# Patient Record
Sex: Male | Born: 1965 | Race: White | Hispanic: No | Marital: Married | Smoking: Never smoker
Health system: Southern US, Community
[De-identification: ages and names within clinical notes are randomized; demographics above are authoritative.]

## PROBLEM LIST (undated history)

## (undated) DIAGNOSIS — R079 Chest pain, unspecified: Secondary | ICD-10-CM

## (undated) DIAGNOSIS — R140 Abdominal distension (gaseous): Secondary | ICD-10-CM

## (undated) DIAGNOSIS — K219 Gastro-esophageal reflux disease without esophagitis: Secondary | ICD-10-CM

## (undated) DIAGNOSIS — K148 Other diseases of tongue: Secondary | ICD-10-CM

## (undated) DIAGNOSIS — R430 Anosmia: Secondary | ICD-10-CM

## (undated) DIAGNOSIS — M1990 Unspecified osteoarthritis, unspecified site: Secondary | ICD-10-CM

## (undated) DIAGNOSIS — I499 Cardiac arrhythmia, unspecified: Secondary | ICD-10-CM

## (undated) DIAGNOSIS — E785 Hyperlipidemia, unspecified: Secondary | ICD-10-CM

## (undated) DIAGNOSIS — R739 Hyperglycemia, unspecified: Secondary | ICD-10-CM

## (undated) DIAGNOSIS — R7989 Other specified abnormal findings of blood chemistry: Secondary | ICD-10-CM

## (undated) DIAGNOSIS — J309 Allergic rhinitis, unspecified: Secondary | ICD-10-CM

## (undated) DIAGNOSIS — K5790 Diverticulosis of intestine, part unspecified, without perforation or abscess without bleeding: Secondary | ICD-10-CM

## (undated) DIAGNOSIS — N522 Drug-induced erectile dysfunction: Secondary | ICD-10-CM

## (undated) DIAGNOSIS — R1013 Epigastric pain: Secondary | ICD-10-CM

## (undated) DIAGNOSIS — I10 Essential (primary) hypertension: Secondary | ICD-10-CM

## (undated) DIAGNOSIS — R1110 Vomiting, unspecified: Secondary | ICD-10-CM

## (undated) DIAGNOSIS — R1310 Dysphagia, unspecified: Secondary | ICD-10-CM

## (undated) DIAGNOSIS — I491 Atrial premature depolarization: Secondary | ICD-10-CM

## (undated) HISTORY — DX: Unspecified osteoarthritis, unspecified site: M19.90

## (undated) HISTORY — DX: Dysphagia, unspecified: R13.10

## (undated) HISTORY — DX: Atrial premature depolarization: I49.1

## (undated) HISTORY — DX: Gastro-esophageal reflux disease without esophagitis: K21.9

## (undated) HISTORY — DX: Other diseases of tongue: K14.8

## (undated) HISTORY — DX: Diverticulosis of intestine, part unspecified, without perforation or abscess without bleeding: K57.90

## (undated) HISTORY — DX: Epigastric pain: R10.13

## (undated) HISTORY — DX: Hyperlipidemia, unspecified: E78.5

## (undated) HISTORY — DX: Allergic rhinitis, unspecified: J30.9

## (undated) HISTORY — DX: Chest pain, unspecified: R07.9

## (undated) HISTORY — DX: Cardiac arrhythmia, unspecified: I49.9

## (undated) HISTORY — DX: Essential (primary) hypertension: I10

## (undated) HISTORY — DX: Hyperglycemia, unspecified: R73.9

## (undated) HISTORY — DX: Other specified abnormal findings of blood chemistry: R79.89

## (undated) HISTORY — DX: Anosmia: R43.0

## (undated) HISTORY — DX: Abdominal distension (gaseous): R14.0

## (undated) HISTORY — DX: Drug-induced erectile dysfunction: N52.2

## (undated) HISTORY — DX: Vomiting, unspecified: R11.10

---

## 1999-05-24 ENCOUNTER — Encounter: Payer: Self-pay | Admitting: Family Medicine

## 1999-05-24 ENCOUNTER — Ambulatory Visit (HOSPITAL_COMMUNITY): Admission: RE | Admit: 1999-05-24 | Discharge: 1999-05-24 | Payer: Self-pay | Admitting: Family Medicine

## 2001-11-14 ENCOUNTER — Ambulatory Visit (HOSPITAL_COMMUNITY): Admission: RE | Admit: 2001-11-14 | Discharge: 2001-11-14 | Payer: Self-pay | Admitting: Family Medicine

## 2001-11-14 ENCOUNTER — Encounter: Payer: Self-pay | Admitting: Family Medicine

## 2005-09-17 ENCOUNTER — Encounter: Admission: RE | Admit: 2005-09-17 | Discharge: 2005-09-17 | Payer: Self-pay | Admitting: Otolaryngology

## 2014-05-25 ENCOUNTER — Emergency Department (HOSPITAL_COMMUNITY)
Admission: EM | Admit: 2014-05-25 | Discharge: 2014-05-25 | Disposition: A | Discharge status: Home / Self Care | Payer: BLUE CROSS/BLUE SHIELD | Attending: Emergency Medicine | Admitting: Emergency Medicine

## 2014-05-25 ENCOUNTER — Other Ambulatory Visit: Payer: Self-pay

## 2014-05-25 ENCOUNTER — Emergency Department (HOSPITAL_COMMUNITY): Payer: BLUE CROSS/BLUE SHIELD

## 2014-05-25 ENCOUNTER — Encounter (HOSPITAL_COMMUNITY): Payer: Self-pay | Admitting: *Deleted

## 2014-05-25 DIAGNOSIS — R079 Chest pain, unspecified: Secondary | ICD-10-CM | POA: Diagnosis present

## 2014-05-25 DIAGNOSIS — R002 Palpitations: Secondary | ICD-10-CM

## 2014-05-25 DIAGNOSIS — R0602 Shortness of breath: Secondary | ICD-10-CM | POA: Diagnosis not present

## 2014-05-25 LAB — BRAIN NATRIURETIC PEPTIDE: B Natriuretic Peptide: 23.9 pg/mL (ref 0.0–100.0)

## 2014-05-25 LAB — I-STAT TROPONIN, ED: Troponin i, poc: 0 ng/mL (ref 0.00–0.08)

## 2014-05-25 LAB — CBC WITH DIFFERENTIAL/PLATELET
BASOS ABS: 0 10*3/uL (ref 0.0–0.1)
Basophils Relative: 0 % (ref 0–1)
EOS ABS: 0 10*3/uL (ref 0.0–0.7)
Eosinophils Relative: 1 % (ref 0–5)
HCT: 38.7 % — ABNORMAL LOW (ref 39.0–52.0)
Hemoglobin: 13.8 g/dL (ref 13.0–17.0)
LYMPHS PCT: 28 % (ref 12–46)
Lymphs Abs: 1.7 10*3/uL (ref 0.7–4.0)
MCH: 32.6 pg (ref 26.0–34.0)
MCHC: 35.7 g/dL (ref 30.0–36.0)
MCV: 91.5 fL (ref 78.0–100.0)
MONOS PCT: 7 % (ref 3–12)
Monocytes Absolute: 0.4 10*3/uL (ref 0.1–1.0)
Neutro Abs: 3.8 10*3/uL (ref 1.7–7.7)
Neutrophils Relative %: 64 % (ref 43–77)
PLATELETS: 199 10*3/uL (ref 150–400)
RBC: 4.23 MIL/uL (ref 4.22–5.81)
RDW: 12.5 % (ref 11.5–15.5)
WBC: 5.9 10*3/uL (ref 4.0–10.5)

## 2014-05-25 LAB — COMPREHENSIVE METABOLIC PANEL
ALBUMIN: 3.9 g/dL (ref 3.5–5.2)
ALT: 26 U/L (ref 0–53)
AST: 26 U/L (ref 0–37)
Alkaline Phosphatase: 78 U/L (ref 39–117)
Anion gap: 8 (ref 5–15)
BUN: 16 mg/dL (ref 6–23)
CHLORIDE: 105 mmol/L (ref 96–112)
CO2: 23 mmol/L (ref 19–32)
CREATININE: 0.99 mg/dL (ref 0.50–1.35)
Calcium: 9 mg/dL (ref 8.4–10.5)
GFR calc Af Amer: >90 mL/min (ref >90)
GFR calc non Af Amer: >90 mL/min (ref >90)
GLUCOSE: 91 mg/dL (ref 70–99)
POTASSIUM: 3.8 mmol/L (ref 3.5–5.1)
Sodium: 136 mmol/L (ref 135–145)
TOTAL PROTEIN: 6.6 g/dL (ref 6.0–8.3)
Total Bilirubin: 0.6 mg/dL (ref 0.3–1.2)

## 2014-05-25 LAB — D-DIMER, QUANTITATIVE: D-Dimer, Quant: <0.27 ug/mL-FEU (ref 0.00–0.48)

## 2014-05-25 LAB — TSH: TSH: 1.068 u[IU]/mL (ref 0.350–4.500)

## 2014-05-25 NOTE — Discharge Instructions (Signed)
Your lab work, chest xray all normal. Your ECG shows something we call "ectopic atrial rhythm." It could be what is causing your symptoms. This will need further evaluation, and you will be called by cardiology in the next few days for an apt. Please follow up with them.    Palpitations A palpitation is the feeling that your heartbeat is irregular or is faster than normal. It may feel like your heart is fluttering or skipping a beat. Palpitations are usually not a serious problem. However, in some cases, you may need further medical evaluation. CAUSES  Palpitations can be caused by:  Smoking.  Caffeine or other stimulants, such as diet pills or energy drinks.  Alcohol.  Stress and anxiety.  Strenuous physical activity.  Fatigue.  Certain medicines.  Heart disease, especially if you have a history of irregular heart rhythms (arrhythmias), such as atrial fibrillation, atrial flutter, or supraventricular tachycardia.  An improperly working pacemaker or defibrillator. DIAGNOSIS  To find the cause of your palpitations, your health care provider will take your medical history and perform a physical exam. Your health care provider may also have you take a test called an ambulatory electrocardiogram (ECG). An ECG records your heartbeat patterns over a 24-hour period. You may also have other tests, such as:  Transthoracic echocardiogram (TTE). During echocardiography, sound waves are used to evaluate how blood flows through your heart.  Transesophageal echocardiogram (TEE).  Cardiac monitoring. This allows your health care provider to monitor your heart rate and rhythm in real time.  Holter monitor. This is a portable device that records your heartbeat and can help diagnose heart arrhythmias. It allows your health care provider to track your heart activity for several days, if needed.  Stress tests by exercise or by giving medicine that makes the heart beat faster. TREATMENT  Treatment  of palpitations depends on the cause of your symptoms and can vary greatly. Most cases of palpitations do not require any treatment other than time, relaxation, and monitoring your symptoms. Other causes, such as atrial fibrillation, atrial flutter, or supraventricular tachycardia, usually require further treatment. HOME CARE INSTRUCTIONS   Avoid:  Caffeinated coffee, tea, soft drinks, diet pills, and energy drinks.  Chocolate.  Alcohol.  Stop smoking if you smoke.  Reduce your stress and anxiety. Things that can help you relax include:  A method of controlling things in your body, such as your heartbeats, with your mind (biofeedback).  Yoga.  Meditation.  Physical activity such as swimming, jogging, or walking.  Get plenty of rest and sleep. SEEK MEDICAL CARE IF:   You continue to have a fast or irregular heartbeat beyond 24 hours.  Your palpitations occur more often. SEEK IMMEDIATE MEDICAL CARE IF:  You have chest pain or shortness of breath.  You have a severe headache.  You feel dizzy or you faint. MAKE SURE YOU:  Understand these instructions.  Will watch your condition.  Will get help right away if you are not doing well or get worse. Document Released: 03/17/2000 Document Revised: 03/25/2013 Document Reviewed: 05/19/2011 [facility] Patient Information 2015 [employer] [place]. This information is not intended to replace advice given to you by your health care provider. Make sure you discuss any questions you have with your health care provider.

## 2014-05-25 NOTE — ED Notes (Signed)
[facility] rt recurrent chest pain lasting on and off again over the past month. Pt describes chest pain with occasional radiation to the neck and SOB. Pt received 324 mg of aspirin and 1 nitro PTA and is currently pain free. Pt does travel often via airplane rt work.

## 2014-05-25 NOTE — ED Provider Notes (Signed)
CSN: [id number]     Arrival date & time 05/25/14  1737 History   First MD Initiated Contact with Patient 05/25/14 1745     Chief Complaint  Patient presents with  . Chest Pain     (Consider location/radiation/quality/duration/timing/severity/associated sxs/prior Treatment) HPI Roselind RilyKeith Servello is a 49 y.o. male with no medical problems, presents to emergency department complaining of chest pain. Patient states his symptoms began approximately 1 month ago. He reports generalized chest tightness, palpitations, sensation of "heart pounding" and some shortness of breath. States symptoms are worse when he is laying still or not doing anything. States better when he is busy. He states he has been taking 325 mg of aspirin for the last several days. He states pain sometimes radiates to the neck. He reports shortness of breath with exertion. He admits to travel by airplane weekly, states he has been doing that for multiple years. He admits to daily coffee, but states he has also been doing that for years. He denies any new medications, and he does not take any over-the-counter medications or supplements other than aspirin. He denies any swelling in his extremities. He denies orthopnea. He denies any fever or chills. No cough. No history of similar pain in the past. He reports symptoms are constant and still having them at present.  History reviewed. No pertinent past medical history. History reviewed. No pertinent past surgical history. No family history on file. History  Substance Use Topics  . Smoking status: Never Smoker   . Smokeless tobacco: Not on file  . Alcohol Use: Yes     Comment: social    Review of Systems  Constitutional: Negative for fever and chills.  Respiratory: Positive for chest tightness and shortness of breath. Negative for cough.   Cardiovascular: Positive for chest pain and palpitations. Negative for leg swelling.  Gastrointestinal: Negative for nausea, vomiting, abdominal pain,  diarrhea and abdominal distention.  Genitourinary: Negative for dysuria, urgency, frequency and hematuria.  Musculoskeletal: Negative for myalgias, arthralgias, neck pain and neck stiffness.  Skin: Negative for rash.  Neurological: Negative for dizziness, weakness, light-headedness, numbness and headaches.  All other systems reviewed and are negative.     Allergies  Review of patient's allergies indicates no known allergies.  Home Medications   Prior to Admission medications   Not on File   BP 112/82 mmHg  Pulse 75  Temp(Src) 98.3 F (36.8 C) (Oral)  Resp 17  Ht 6' (1.829 m)  Wt 207 lb (93.895 kg)  BMI 28.07 kg/m2  SpO2 99% Physical Exam  Constitutional: He appears well-developed and well-nourished. No distress.  HENT:  Head: Normocephalic and atraumatic.  Eyes: Conjunctivae are normal.  Neck: Neck supple.  Cardiovascular: Normal rate, regular rhythm and normal heart sounds.   Pulmonary/Chest: Effort normal. No respiratory distress. He has no wheezes. He has no rales.  Abdominal: Soft. Bowel sounds are normal. He exhibits no distension. There is no tenderness. There is no rebound.  Musculoskeletal: He exhibits no edema.  Neurological: He is alert.  Skin: Skin is warm and dry.  Nursing note and vitals reviewed.   ED Course  Procedures (including critical care time) Labs Review Labs Reviewed  CBC WITH DIFFERENTIAL/PLATELET - Abnormal; Notable for the following:    HCT 38.7 (*)    All other components within normal limits  COMPREHENSIVE METABOLIC PANEL  BRAIN NATRIURETIC PEPTIDE  D-DIMER, QUANTITATIVE  TSH  I-STAT TROPOININ, ED    Imaging Review Dg Chest 2 View  05/25/2014   CLINICAL  DATA:  Shortness of breath and midsternal chest pain, constant for 1 month. Nonsmoker.  EXAM: CHEST  2 VIEW  COMPARISON:  Thoracic spine 03/24/2008.  FINDINGS: The heart size and mediastinal contours are within normal limits. Both lungs are clear. The visualized skeletal structures  are unremarkable.  IMPRESSION: No active cardiopulmonary disease.   Electronically Signed   By: Burman Nieves M.D.   On: 05/25/2014 18:50     Date: 05/25/2014  Rate: 70  Rhythm: ectopic atrial rhythm  QRS Axis: normal  Intervals: normal  ST/T Wave abnormalities: early repolarization  Conduction Disutrbances:none  Narrative Interpretation:   Old EKG Reviewed: none available    MDM   Final diagnoses:  Palpitations    Pt with chest pain constant for a month, mostly concerned about "pounding sensation" in the chest. Mainly at rest. Pt's CP is atypical. His heart score is 2 - repolarization on ECG and age. Pt's work up including D dimer, BNP, trop, CXR all negative. Pt's ECG showing ectopic atrial rhythm. i discussed pt's symptoms and ECG findings with cardiology on call. Advised outpatient evaluation is appropriate with possible holter. They will schedule a call back for him for his apt. Discussed results with pt. He will follow up with cardiology outpatient. Currently VS normal. He is in no distress. Comfortable and non toxic appearing.   Filed Vitals:   05/25/14 1740 05/25/14 1745 05/25/14 1815 05/25/14 2000  BP: 142/65 112/82 141/84 143/75  Pulse: 66 75 76 73  Temp:  98.3 F (36.8 C)    TempSrc:  Oral    Resp: Height:  6' (1.829 m)    Weight:  207 lb (93.895 kg)    SpO2: 98% 99% 100% 98%       Lottie Mussel, PA-C 05/25/14 2324  Arby Barrette, MD 05/27/14 [phone]

## 2014-05-29 ENCOUNTER — Ambulatory Visit (INDEPENDENT_AMBULATORY_CARE_PROVIDER_SITE_OTHER): Payer: BLUE CROSS/BLUE SHIELD | Admitting: Cardiovascular Disease

## 2014-05-29 ENCOUNTER — Encounter: Payer: Self-pay | Admitting: Cardiovascular Disease

## 2014-05-29 VITALS — BP 116/90 | HR 73 | Ht 72.0 in | Wt 208.8 lb

## 2014-05-29 DIAGNOSIS — I491 Atrial premature depolarization: Secondary | ICD-10-CM

## 2014-05-29 DIAGNOSIS — R002 Palpitations: Secondary | ICD-10-CM

## 2014-05-29 DIAGNOSIS — I498 Other specified cardiac arrhythmias: Secondary | ICD-10-CM

## 2014-05-29 HISTORY — DX: Atrial premature depolarization: I49.1

## 2014-05-29 NOTE — Patient Instructions (Signed)
NO CHANGES WITH MEDICATIONS.  Your physician wants you to follow-up in: 6 MONTHS WITH DR. Elease HashimotoNAHSER. You will receive a reminder letter in the mail two months in advance. If you don't receive a letter, please call our office to schedule the follow-up appointment.

## 2014-05-29 NOTE — Progress Notes (Signed)
Cardiology Office Note   Date:  05/29/2014   ID:  Devon Whitaker, DOB 1965/09/21, MRN [id number]  PCP:  No primary care provider on file.  Cardiologist:   Nahser, Deloris Ping, MD   Chief Complaint  Patient presents with  . Palpitations      History of Present Illness: Devon Whitaker is a 49 y.o. male who presents for follow up of his palpitations.  He complains of CP and palpitations for the past month.  Pressure, tightness, discomfort.  Occurs at various times  - usually more prominent when he is resting.  Does not notice it when he is active.   Exercises regularly.   Has some fatigue.  Fatigues  Earlier  at the gym now compared to last year.   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  No past medical history on file.  No past surgical history on file.   Current Outpatient Prescriptions  Medication Sig Dispense Refill  . aspirin 325 MG tablet Take 325 mg by mouth daily.     No current facility-administered medications for this visit.    Allergies:   Review of patient's allergies indicates no known allergies.    Social History:  The patient  reports that he has never smoked. He does not have any smokeless tobacco history on file. He reports that he drinks alcohol.   Family History:  The patient's family history is not on file.    ROS:  Please see the history of present illness.    Review of Systems: Constitutional:  denies fever, chills, diaphoresis, appetite change and fatigue.  HEENT: denies photophobia, eye pain, redness, hearing loss, ear pain, congestion, sore throat, rhinorrhea, sneezing, neck pain, neck stiffness and tinnitus.  Respiratory: denies SOB, DOE, cough, chest tightness, and wheezing.  Cardiovascular: admits to chest pain, palpitations, denies any  leg swelling.  Gastrointestinal: denies nausea, vomiting, abdominal pain, diarrhea, constipation, blood in stool.  Genitourinary: denies dysuria, urgency, frequency, hematuria, flank pain and  difficulty urinating.  Musculoskeletal: denies  myalgias, back pain, joint swelling, arthralgias and gait problem.   Skin: denies pallor, rash and wound.  Neurological: denies dizziness, seizures, syncope, weakness, light-headedness, numbness and headaches.   Hematological: denies adenopathy, easy bruising, personal or family bleeding history.  Psychiatric/ Behavioral: denies suicidal ideation, mood changes, confusion, nervousness, sleep disturbance and agitation.       All other systems are reviewed and negative.    PHYSICAL EXAM: VS:  BP 116/90 mmHg  Pulse 73  Ht 6' (1.829 m)  Wt 208 lb 12.8 oz (94.711 kg)  BMI 28.31 kg/m2 , BMI Body mass index is 28.31 kg/(m^2). GEN: Well nourished, well developed, in no acute distress HEENT: normal Neck: no JVD, carotid bruits, or masses Cardiac: RRR; no murmurs, rubs, or gallops,no edema  Respiratory:  clear to auscultation bilaterally, normal work of breathing GI: soft, nontender, nondistended, + BS MS: no deformity or atrophy Skin: warm and dry, no rash Neuro:  Strength and sensation are intact Psych: normal   EKG:  EKG is ordered today. The ekg ordered today demonstrates NSR at 73.  Normal ECG Previous ECG in the ER showed ectopic atrial rhytm.    Recent Labs: 05/25/2014: ALT 26; B Natriuretic Peptide 23.9; BUN 16; Creatinine 0.99; Hemoglobin 13.8; Platelets 199; Potassium 3.8; Sodium 136; TSH 1.068    Lipid Panel No results found for: CHOL, TRIG, HDL, CHOLHDL, VLDL, LDLCALC, LDLDIRECT    Wt Readings from Last 3 Encounters:  05/29/14 208 lb 12.8 oz (  94.711 kg)  05/25/14 207 lb (93.895 kg)      Other studies Reviewed: Additional studies/ records that were reviewed today include: . Review of the above records demonstrates:    ASSESSMENT AND PLAN:  1.  Chest discomfort/palpitations: The patient presents with very atypical chest discomfort. He's had vague discomfort for the past months. Is not worsened with exercise. He  occasionally has some palpitations. In the emergency room, he was found have an ectopic atrial rhythm.  He feels quite well when he exercises. In fact, he works out on a regular basis and never had any issues during his exercise regimen.  I do not think a stress test would be of any benefit.  He works 17 hour days - 2 days a week [place] Marland [employer]. He then flies home to [place]. In addition, he drinks a fair amount of alcohol when he  comes home for the weekend.  I suspect that he may need to cut back on his alcohol and get a bit more sleep.  I will see him in 6 months for follow up office visit. Marland [place].  He will call sooner if her has any problems.    Current medicines are reviewed at length with the patient today.  The patient does not have concerns regarding medicines.  The following changes have been made:  no change   Disposition:   FU with me in 6 months.     Signed, Nahser, Deloris PingPhilip J, MD  05/29/2014 3:57 PM    [facility] [address], [place], [place]  [address] Phone: [phone]; Fax: [phone]

## 2014-07-10 ENCOUNTER — Encounter: Payer: BLUE CROSS/BLUE SHIELD | Admitting: Cardiology

## 2015-09-20 DIAGNOSIS — L820 Inflamed seborrheic keratosis: Secondary | ICD-10-CM | POA: Diagnosis not present

## 2015-09-20 DIAGNOSIS — L918 Other hypertrophic disorders of the skin: Secondary | ICD-10-CM | POA: Diagnosis not present

## 2015-09-20 DIAGNOSIS — L570 Actinic keratosis: Secondary | ICD-10-CM | POA: Diagnosis not present

## 2015-09-20 DIAGNOSIS — D225 Melanocytic nevi of trunk: Secondary | ICD-10-CM | POA: Diagnosis not present

## 2015-09-20 DIAGNOSIS — X32XXXA Exposure to sunlight, initial encounter: Secondary | ICD-10-CM | POA: Diagnosis not present

## 2015-11-03 ENCOUNTER — Telehealth: Payer: Self-pay | Admitting: Cardiovascular Disease

## 2015-11-03 NOTE — Telephone Encounter (Signed)
Left message on several phone lines for patient to call back.

## 2015-11-03 NOTE — Telephone Encounter (Signed)
° °  Pt c/o Shortness Of Breath: STAT if SOB developed within the last 24 hours or pt is noticeably SOB on the phone  1. Are you currently SOB (can you hear that pt is SOB on the phone)? No  2. How long have you been experiencing SOB? Last time was last weekend  3. Are you SOB when sitting or when up moving around? Both  4. Are you currently experiencing any other symptoms? Tingling in his left toes, foot, and radiating up left leg.

## 2015-11-09 ENCOUNTER — Telehealth: Payer: Self-pay | Admitting: Cardiovascular Disease

## 2015-11-09 NOTE — Telephone Encounter (Signed)
Mr. Devon Whitaker returning call.  States he has been SOB and some tightness in his chest that he describes as off and on for several days. [place] and will not be back in town until Thursday 8/10. Advised we do not have any app available at this time but to call back on Thursday when he gets back to town.  Also advised if this persists or gets worse will need to go to ER or an Urgent Care. He verbalizes understanding and will call us back Thursday.

## 2015-11-09 NOTE — Telephone Encounter (Signed)
New message   Pt verbalized that he was instructed to call and request the RN Synetta Failnita in triage   Per Synetta Failnita she will call him back

## 2015-11-09 NOTE — Telephone Encounter (Signed)
Wife calling requesting an earlier appointment than Oct.  Advised we do not have DPR to speak with her.  Mr. Devon Whitaker is out of town and will return on Thursday.  She will have him call us back today.

## 2015-11-09 NOTE — Telephone Encounter (Signed)
Follow up ° ° ° ° ° °Returning a call to the nurse °

## 2015-11-11 ENCOUNTER — Telehealth: Payer: Self-pay | Admitting: *Deleted

## 2015-11-11 NOTE — Telephone Encounter (Signed)
Calling back stating he is driving back from [place]  States tightness in chest is some better but still feels the discomfort.  Made him an appointment for Tues w/Katie Janee Mornhompson, [place].  Advised if symptoms increased will need to go to ER or urgent care.  He verbalizes understanding and will see how he feels over the weekend.  He will call and cancel the appointment on Tues if he does go to ER and is treated.

## 2015-11-11 NOTE — Progress Notes (Signed)
Cardiology Office Note    Date:  11/16/2015   ID:  Devon Whitaker, DOB 07/15/1965, MRN [id number]  PCP:  Redmond Baseman, MD  Cardiologist:  Dr. Elease Hashimoto  CC: chest tightness and SOB  History of Present Illness:  Devon Whitaker is a 50 y.o. male with a past medical history of an ectopic atrial arrhythmia and atypical chest pain/palpitations who presents to clinic for evaluation of chest tightness.   He was seen in the [facility] ED on 05/27/14 for evaluation of chest pain. Work up including D dimer, BNP, trop, CXR all negative. Pt's ECG showed an ectopic atrial rhythm. He was discharged home with outpatient follow up.   He was seen by Dr. Elease Hashimoto in clinic on 05/29/2014 for new patient evaluation of chest pain and palpitations. Most predominant sx was palpitations. Dr. Elease Hashimoto felt like his pain was very atypical and that he needed to get more sleep and cut back on alcohol consumption. He noted very regular exercise with no problems and worked 17 hour days in [place]. His palpitations did eventually improve.   Today he presents to clinic for follow up. About 2 weeks ago he started to notice worsening SOB and chest pressure that radiates into his back. He notices this sometime after breakfast and its constant until the evening time. Not worse with exertion and he does exercise regularly, including this AM (30 minutes on treadmill and heavy weight lifting). Symptoms persistent during work out but actually better then other times and right now (currently 8/10). He notices the chest tightness and SOB gets worse after meals. No LE edema, orthopnea or PND. No more palpitations. He has not tried any OTC meds such as PPIs or H2 blockers. He does have  A lot of anxiety in his life with constant travel.   He does not smoke and has no HTN, HLD, DM or fam hx of early CAD. Maternal Aunt did possibly have heart attack in 17s?   Past Medical History:  Diagnosis Date  . 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  Current Medications: Outpatient Medications Prior to Visit  Medication Sig Dispense Refill  . aspirin 325 MG tablet Take 325 mg by mouth daily.     No facility-administered medications prior to visit.      Allergies:   Review of patient's allergies indicates no known allergies.   Social History   Social History  . Marital status: Married    Spouse name: N/A  . Number of children: N/A  . Years of education: N/A   Social History Main Topics  . Smoking status: Never Smoker  . Smokeless tobacco: None  . Alcohol use Yes     Comment: social  . Drug use: Unknown  . Sexual activity: Not Asked   Other Topics Concern  . None   Social History Narrative  . None     Family History:  The patient's   Heart attack in his maternal aunt  ROS:   Please see the history of present illness.    ROS All other systems reviewed and are negative.   PHYSICAL EXAM:   VS:  BP 122/74   Pulse 77   Ht 6' (1.829 m)   Wt 200 lb (90.7 kg)   BMI 27.12 kg/m    GEN: Well nourished, well developed, in no acute distress  HEENT: normal  Neck: no JVD, carotid bruits, or masses Cardiac: RRR; no murmurs, rubs, or gallops,no edema  Respiratory:  clear to auscultation bilaterally, normal work of breathing GI: soft, nontender, nondistended, + BS MS: no deformity or atrophy  Skin: warm and dry, no rash Neuro:  Alert and Oriented x 3, Strength and sensation are intact Psych: euthymic mood, full affect  Wt Readings from Last 3 Encounters:  11/16/15 200 lb (90.7 kg)  05/29/14 208 lb 12.8 oz (94.7 kg)  05/25/14 207 lb (93.9 kg)      Studies/Labs Reviewed:   EKG:  EKG is ordered today.  The ekg ordered today demonstrates NSR HR 77 Recent Labs: No results found for requested labs within last 8760 hours.   Lipid Panel No results found for: CHOL, TRIG, HDL, CHOLHDL, VLDL, LDLCALC, LDLDIRECT  Additional studies/ records that were reviewed today include:   none   ASSESSMENT & PLAN:   Chest pain and SOB: atypical for cardiac etiology. Better with exertion and worse after meals. No RFs for CAD. ECG totally normal. Will start PPI to see if this helps and get POET for his own reassurance. If PPI doesn't help and stress test normal; likely 2/2 to anxiety.    Medication Adjustments/Labs and Tests Ordered: Current medicines are reviewed at length with the patient today.  Concerns regarding medicines are outlined above.  Medication changes, Labs and Tests ordered today are listed in the Patient Instructions below. Patient Instructions  Medication Instructions:  Your physician has recommended you make the following change in your medication:  1.  START Protonix 40 mg taking 1 tablet twice a day   Labwork: NONE ORDERED   Testing/Procedures: Your physician has requested that you have an exercise tolerance test. For further information please visit [email or web address] Please also follow instruction sheet, as given.  Follow-Up: Your physician recommends that you schedule a follow-up appointment in: SEE DR. NAHSER AS PLANNED   Any Other Special Instructions Will Be Listed Below (If Applicable).   Exercise Stress Electrocardiogram An exercise stress electrocardiogram is a test to check how blood flows to your heart. It is done to find areas of poor blood flow. You will need to walk on a treadmill for this test. The electrocardiogram will record your heartbeat when you are at rest and when you are exercising. BEFORE THE PROCEDURE  Do not have drinks with caffeine or foods with caffeine for 24 hours before the test, or as told by your doctor. This includes coffee, tea (even decaf tea), sodas, chocolate, and cocoa.  Follow your doctor's instructions about eating and drinking before the test.  Ask your doctor what medicines you should or should not take before the test. Take your medicines with water unless told by your doctor not to.  If you  use an inhaler, bring it with you to the test.  Bring a snack to eat after the test.  Do not  smoke for 4 hours before the test.  Do not put lotions, powders, creams, or oils on your chest before the test.  Wear comfortable shoes and clothing. PROCEDURE  You will have patches put on your chest. Small areas of your chest may need to be shaved. Wires will be connected to the patches.  Your heart rate will be watched while you are resting and while you are exercising.  You will walk on the treadmill. The treadmill will slowly get faster to raise your heart rate.  The test will take about 1-2 hours. AFTER THE PROCEDURE  Your heart rate and blood pressure will be watched after the test.  You may return to your  normal diet, activities, and medicines or as told by your doctor.   This information is not intended to replace advice given to you by your health care provider. Make sure you discuss any questions you have with your health care provider.   Document Released: 09/06/2007 Document Revised: 04/10/2014 Document Reviewed: 11/25/2012 Elsevier Interactive Patient Education [employer]    If you need a refill on your cardiac medications before your next appointment, please call your pharmacy.      Signed, Cline Crock, PA-C  11/16/2015 12:50 PM    [facility] [address] [place], [place], [place]  [address] Phone: [phone]; Fax: [phone]

## 2015-11-16 ENCOUNTER — Encounter: Payer: Self-pay | Admitting: Physician Assistant

## 2015-11-16 ENCOUNTER — Ambulatory Visit (INDEPENDENT_AMBULATORY_CARE_PROVIDER_SITE_OTHER): Payer: BLUE CROSS/BLUE SHIELD | Admitting: Physician Assistant

## 2015-11-16 VITALS — BP 122/74 | HR 77 | Ht 72.0 in | Wt 200.0 lb

## 2015-11-16 DIAGNOSIS — R079 Chest pain, unspecified: Secondary | ICD-10-CM

## 2015-11-16 MED ORDER — PANTOPRAZOLE SODIUM 40 MG PO TBEC: 40.0000 mg | DELAYED_RELEASE_TABLET | Freq: Two times a day (BID) | ORAL | 1 refills | Status: DC

## 2015-11-16 NOTE — Patient Instructions (Addendum)
Medication Instructions:  Your physician has recommended you make the following change in your medication:  1.  START Protonix 40 mg taking 1 tablet twice a day   Labwork: NONE ORDERED   Testing/Procedures: Your physician has requested that you have an exercise tolerance test. For further information please visit [email or web address]. Please also follow instruction sheet, as given.  Follow-Up: Your physician recommends that you schedule a follow-up appointment in: SEE DR. NAHSER AS PLANNED   Any Other Special Instructions Will Be Listed Below (If Applicable).   Exercise Stress Electrocardiogram An exercise stress electrocardiogram is a test to check how blood flows to your heart. It is done to find areas of poor blood flow. You will need to walk on a treadmill for this test. The electrocardiogram will record your heartbeat when you are at rest and when you are exercising. BEFORE THE PROCEDURE  Do not have drinks with caffeine or foods with caffeine for 24 hours before the test, or as told by your doctor. This includes coffee, tea (even decaf tea), sodas, chocolate, and cocoa.  Follow your doctor's instructions about eating and drinking before the test.  Ask your doctor what medicines you should or should not take before the test. Take your medicines with water unless told by your doctor not to.  If you use an inhaler, bring it with you to the test.  Bring a snack to eat after the test.  Do not  smoke for 4 hours before the test.  Do not put lotions, powders, creams, or oils on your chest before the test.  Wear comfortable shoes and clothing. PROCEDURE  You will have patches put on your chest. Small areas of your chest may need to be shaved. Wires will be connected to the patches.  Your heart rate will be watched while you are resting and while you are exercising.  You will walk on the treadmill. The treadmill will slowly get faster to raise your heart rate.  The test will  take about 1-2 hours. AFTER THE PROCEDURE  Your heart rate and blood pressure will be watched after the test.  You may return to your normal diet, activities, and medicines or as told by your doctor.   This information is not intended to replace advice given to you by your health care provider. Make sure you discuss any questions you have with your health care provider.   Document Released: 09/06/2007 Document Revised: 04/10/2014 Document Reviewed: 11/25/2012 Elsevier Interactive Patient Education [employer] Elsevier Inc.    If you need a refill on your cardiac medications before your next appointment, please call your pharmacy.

## 2015-11-22 ENCOUNTER — Ambulatory Visit (INDEPENDENT_AMBULATORY_CARE_PROVIDER_SITE_OTHER): Payer: BLUE CROSS/BLUE SHIELD

## 2015-11-22 DIAGNOSIS — R079 Chest pain, unspecified: Secondary | ICD-10-CM

## 2015-11-25 ENCOUNTER — Ambulatory Visit: Payer: BLUE CROSS/BLUE SHIELD | Admitting: Physician Assistant

## 2015-11-26 ENCOUNTER — Telehealth: Payer: Self-pay | Admitting: Cardiovascular Disease

## 2015-11-26 DIAGNOSIS — R079 Chest pain, unspecified: Secondary | ICD-10-CM

## 2015-11-26 DIAGNOSIS — R0602 Shortness of breath: Secondary | ICD-10-CM

## 2015-11-26 LAB — EXERCISE TOLERANCE TEST
CSEPEDS: 1 s
CSEPEW: 15.1 METS
CSEPHR: 91 %
Exercise duration (min): 13 min
MPHR: 168 {beats}/min
Peak HR: 153 {beats}/min
RPE: 15
Rest HR: 71 {beats}/min

## 2015-11-26 NOTE — Telephone Encounter (Signed)
Mr. Devon Whitaker is calling about the results of his EKG

## 2015-11-26 NOTE — Telephone Encounter (Signed)
Negative GXT He did have a hypertensive response to exercise - 215 / 74 No evidence of ischemia. He should watch his salt intake. Measure his BP on occasion  He may see me back in several weeks / months if he is not better.

## 2015-11-26 NOTE — Telephone Encounter (Signed)
Spoke with patient who would like Dr. Harvie BridgeNahser's interpretation of GXT on 8/21.  I advised that I will route to Dr. Elease HashimotoNahser and call him back after his review.  He verbalized understanding and agreement.

## 2015-11-26 NOTE — Telephone Encounter (Signed)
Reviewed results with patient.  He states that he continues to have chest pain and SOB.  He would like to have additional testing to evaluate his heart.  I advised that I will send to Dr. Elease HashimotoNahser for advice on possibly ordering an echo prior to office visit in October.  I advised I will call him next week with Dr. Harvie BridgeNahser's advice.  He verbalized understanding and agreement.

## 2015-12-09 NOTE — Telephone Encounter (Signed)
Agree with note from Eligha BridegroomMichelle Swinyer, RN. Lets get an echo prior to his office visit in Oct. If he has CP and shortness of breath that is preventing him from exercising or doing his normal daily activities we should see him sooner to discuss various options

## 2015-12-09 NOTE — Telephone Encounter (Signed)
Message to Trina AoRegina Griffin to schedule echo prior to Oct. 7 office visit with Dr. Elease HashimotoNahser

## 2015-12-13 NOTE — Telephone Encounter (Signed)
ECHO is scheduled for September 22.

## 2015-12-23 ENCOUNTER — Encounter: Payer: Self-pay | Admitting: Cardiovascular Disease

## 2015-12-24 ENCOUNTER — Other Ambulatory Visit: Payer: Self-pay

## 2015-12-24 ENCOUNTER — Ambulatory Visit (HOSPITAL_COMMUNITY): Payer: BLUE CROSS/BLUE SHIELD | Attending: Cardiology

## 2015-12-24 DIAGNOSIS — R0602 Shortness of breath: Secondary | ICD-10-CM

## 2015-12-24 DIAGNOSIS — R079 Chest pain, unspecified: Secondary | ICD-10-CM | POA: Insufficient documentation

## 2016-01-03 ENCOUNTER — Telehealth: Payer: Self-pay | Admitting: Cardiovascular Disease

## 2016-01-03 ENCOUNTER — Encounter: Payer: Self-pay | Admitting: *Deleted

## 2016-01-03 NOTE — Telephone Encounter (Signed)
Mr. Devon Whitaker is calling to get the results of his Echocardiogram . Please call. Thanks

## 2016-01-03 NOTE — Telephone Encounter (Signed)
Notes Recorded by Devon MixerPhilip J Nahser, MD on 12/27/2015 at 3:48 PM EDT Echo is normal  Notified the pt of normal echo results per Dr Elease HashimotoNahser, as mentioned above.  Pt verbalized understanding.

## 2016-01-07 ENCOUNTER — Encounter: Payer: Self-pay | Admitting: Cardiovascular Disease

## 2016-01-07 ENCOUNTER — Ambulatory Visit (INDEPENDENT_AMBULATORY_CARE_PROVIDER_SITE_OTHER): Payer: BLUE CROSS/BLUE SHIELD | Admitting: Cardiovascular Disease

## 2016-01-07 VITALS — BP 104/84 | HR 82 | Ht 72.0 in | Wt 200.8 lb

## 2016-01-07 DIAGNOSIS — R002 Palpitations: Secondary | ICD-10-CM

## 2016-01-07 DIAGNOSIS — R0789 Other chest pain: Secondary | ICD-10-CM | POA: Diagnosis not present

## 2016-01-07 MED ORDER — ASPIRIN EC 81 MG PO TBEC: 81.0000 mg | DELAYED_RELEASE_TABLET | Freq: Every day | ORAL | Status: DC

## 2016-01-07 NOTE — Progress Notes (Signed)
Cardiology Office Note   Date:  01/07/2016   ID:  Devon Whitaker, DOB Aug 06, 1965, MRN [id number]  PCP:  Redmond Baseman, MD  Cardiologist:   Kristeen Miss, MD   Chief Complaint  Patient presents with  . CP f/u    no more CP , per pt      History of Present Illness: Devon Whitaker is a 50 y.o. male who presents for follow up of his palpitations.  He complains of CP and palpitations for the past month.  Pressure, tightness, discomfort.  Occurs at various times  - usually more prominent when he is resting.  Does not notice it when he is active.   Exercises regularly.   Has some fatigue.  Fatigues  Earlier  at the gym now compared to last year.   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  Oct. 6, 2017:  Devon Whitaker is seen back today for eval of chest pain, Is clearly better Saw Carlean Jews, PA who started Protonix Did not really help much. CP and dyspnea have gradually resolved.  Has had a GXT And echocardiogram. Both of these looked good.   He did have a hypertensive response to exercise during his stress test. Has cut his salt intake over the past several month  He exercises regularly .    Past Medical History:  Diagnosis Date  . 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 No pertinent surgical history.   Current Outpatient Prescriptions  Medication Sig Dispense Refill  . aspirin 325 MG tablet Take 325 mg by mouth daily.     No current facility-administered medications for this visit.     Allergies:   Review of patient's allergies indicates no known allergies.    Social History:  The patient  reports that he has never smoked. He has never used smokeless tobacco. He reports that he drinks alcohol. He reports that he does not use drugs.   Family History:  The patient's family history is not on file.    ROS:  Please see the history of present illness.    Review of Systems: Constitutional:  denies fever, chills, diaphoresis, appetite change and  fatigue.  HEENT: denies photophobia, eye pain, redness, hearing loss, ear pain, congestion, sore throat, rhinorrhea, sneezing, neck pain, neck stiffness and tinnitus.  Respiratory: denies SOB, DOE, cough, chest tightness, and wheezing.  Cardiovascular: admits to chest pain, palpitations, denies any  leg swelling.  Gastrointestinal: denies nausea, vomiting, abdominal pain, diarrhea, constipation, blood in stool.  Genitourinary: denies dysuria, urgency, frequency, hematuria, flank pain and difficulty urinating.  Musculoskeletal: denies  myalgias, back pain, joint swelling, arthralgias and gait problem.   Skin: denies pallor, rash and wound.  Neurological: denies dizziness, seizures, syncope, weakness, light-headedness, numbness and headaches.   Hematological: denies adenopathy, easy bruising, personal or family bleeding history.  Psychiatric/ Behavioral: denies suicidal ideation, mood changes, confusion, nervousness, sleep disturbance and agitation.       All other systems are reviewed and negative.    PHYSICAL EXAM: VS:  BP 104/84   Pulse 82   Ht 6' (1.829 m)   Wt 200 lb 12.8 oz (91.1 kg)   SpO2 95%   BMI 27.23 kg/m  , BMI Body mass index is 27.23 kg/m. GEN: Well nourished, well developed, in no acute distress  HEENT: normal  Neck: no JVD, carotid bruits, or masses Cardiac: RRR; no murmurs, rubs, or gallops,no edema  Respiratory:  clear to auscultation bilaterally, normal work of breathing GI: soft, nontender,  nondistended, + BS MS: no deformity or atrophy  Skin: warm and dry, no rash Neuro:  Strength and sensation are intact Psych: normal   EKG:  EKG is ordered today. The ekg ordered today demonstrates NSR at 73.  Normal ECG Previous ECG in the ER showed ectopic atrial rhytm.    Recent Labs: No results found for requested labs within last 8760 hours.    Lipid Panel No results found for: CHOL, TRIG, HDL, CHOLHDL, VLDL, LDLCALC, LDLDIRECT    Wt Readings from Last 3  Encounters:  01/07/16 200 lb 12.8 oz (91.1 kg)  11/16/15 200 lb (90.7 kg)  05/29/14 208 lb 12.8 oz (94.7 kg)      Other studies Reviewed: Additional studies/ records that were reviewed today include: . Review of the above records demonstrates:    ASSESSMENT AND PLAN:  1.  Chest discomfort/palpitations:  These have resolved. He has been watching his salt intake and feels much better. He had a hypertensive response to exercise and I suspect that this may be causing some of his chest discomfort. Now that he has reduced his salt intake, he is feeling better.  I'll see him on an as-needed basis.   Will see him PRN .    Current medicines are reviewed at length with the patient today.  The patient does not have concerns regarding medicines.  The following changes have been made:  no change   Disposition:       Signed, Kristeen MissPhilip Bosten Newstrom, MD  01/07/2016 8:55 AM    [facility] [address], [place], [place]  [address] Phone: [phone](336) [phone]; Fax: [phone]) [phone]

## 2016-01-07 NOTE — Patient Instructions (Addendum)
Medication Instructions:  DECREASE Aspirin to 81 mg once daily   Labwork: None Ordered   Testing/Procedures: None Ordered   Follow-Up: Your physician recommends that you schedule a follow-up appointment in: as needed with Dr. Elease HashimotoNahser.    If you need a refill on your cardiac medications before your next appointment, please call your pharmacy.   Thank you for choosing [facility]! Eligha BridegroomMichelle Brantley Naser, RN [phone]

## 2016-03-01 DIAGNOSIS — R140 Abdominal distension (gaseous): Secondary | ICD-10-CM | POA: Diagnosis not present

## 2016-03-01 DIAGNOSIS — R5383 Other fatigue: Secondary | ICD-10-CM | POA: Diagnosis not present

## 2016-03-01 DIAGNOSIS — R110 Nausea: Secondary | ICD-10-CM | POA: Diagnosis not present

## 2016-07-10 DIAGNOSIS — K5790 Diverticulosis of intestine, part unspecified, without perforation or abscess without bleeding: Secondary | ICD-10-CM | POA: Diagnosis not present

## 2016-07-10 DIAGNOSIS — R1030 Lower abdominal pain, unspecified: Secondary | ICD-10-CM | POA: Diagnosis not present

## 2016-08-11 ENCOUNTER — Other Ambulatory Visit: Payer: Self-pay | Admitting: Physician Assistant

## 2016-08-11 DIAGNOSIS — R140 Abdominal distension (gaseous): Secondary | ICD-10-CM

## 2016-08-25 ENCOUNTER — Ambulatory Visit
Admission: RE | Admit: 2016-08-25 | Discharge: 2016-08-25 | Disposition: A | Discharge status: Home / Self Care | Payer: BLUE CROSS/BLUE SHIELD | Source: Ambulatory Visit | Attending: Physician Assistant | Admitting: Physician Assistant

## 2016-08-25 DIAGNOSIS — R140 Abdominal distension (gaseous): Secondary | ICD-10-CM | POA: Diagnosis not present

## 2016-08-25 MED ORDER — IOPAMIDOL (ISOVUE-300) INJECTION 61%
100.0000 mL | Freq: Once | INTRAVENOUS | Status: AC | PRN
Start: 2016-08-25 — End: 2016-08-25
  Administered 2016-08-25: 100 mL via INTRAVENOUS

## 2016-10-08 DIAGNOSIS — T63443A Toxic effect of venom of bees, assault, initial encounter: Secondary | ICD-10-CM | POA: Diagnosis not present

## 2016-10-08 DIAGNOSIS — L989 Disorder of the skin and subcutaneous tissue, unspecified: Secondary | ICD-10-CM | POA: Diagnosis not present

## 2016-11-13 ENCOUNTER — Ambulatory Visit
Admission: RE | Admit: 2016-11-13 | Discharge: 2016-11-13 | Disposition: A | Discharge status: Home / Self Care | Payer: BLUE CROSS/BLUE SHIELD | Source: Ambulatory Visit | Attending: Gastroenterology | Admitting: Gastroenterology

## 2016-11-13 ENCOUNTER — Other Ambulatory Visit: Payer: Self-pay | Admitting: Gastroenterology

## 2016-11-13 DIAGNOSIS — R140 Abdominal distension (gaseous): Secondary | ICD-10-CM

## 2016-11-13 DIAGNOSIS — R109 Unspecified abdominal pain: Secondary | ICD-10-CM | POA: Diagnosis not present

## 2017-01-05 DIAGNOSIS — E785 Hyperlipidemia, unspecified: Secondary | ICD-10-CM | POA: Diagnosis not present

## 2017-01-05 DIAGNOSIS — Z0000 Encounter for general adult medical examination without abnormal findings: Secondary | ICD-10-CM | POA: Diagnosis not present

## 2017-01-05 DIAGNOSIS — M2550 Pain in unspecified joint: Secondary | ICD-10-CM | POA: Diagnosis not present

## 2017-01-05 DIAGNOSIS — Z125 Encounter for screening for malignant neoplasm of prostate: Secondary | ICD-10-CM | POA: Diagnosis not present

## 2017-01-05 DIAGNOSIS — Z23 Encounter for immunization: Secondary | ICD-10-CM | POA: Diagnosis not present

## 2017-02-20 DIAGNOSIS — I10 Essential (primary) hypertension: Secondary | ICD-10-CM | POA: Diagnosis not present

## 2017-04-02 DIAGNOSIS — E785 Hyperlipidemia, unspecified: Secondary | ICD-10-CM | POA: Diagnosis not present

## 2017-04-02 DIAGNOSIS — I10 Essential (primary) hypertension: Secondary | ICD-10-CM | POA: Diagnosis not present

## 2017-04-30 DIAGNOSIS — I10 Essential (primary) hypertension: Secondary | ICD-10-CM | POA: Diagnosis not present

## 2017-04-30 DIAGNOSIS — E785 Hyperlipidemia, unspecified: Secondary | ICD-10-CM | POA: Diagnosis not present

## 2017-05-28 DIAGNOSIS — I10 Essential (primary) hypertension: Secondary | ICD-10-CM | POA: Diagnosis not present

## 2017-05-28 DIAGNOSIS — E785 Hyperlipidemia, unspecified: Secondary | ICD-10-CM | POA: Diagnosis not present

## 2017-08-10 DIAGNOSIS — N522 Drug-induced erectile dysfunction: Secondary | ICD-10-CM | POA: Diagnosis not present

## 2017-08-10 DIAGNOSIS — I10 Essential (primary) hypertension: Secondary | ICD-10-CM | POA: Diagnosis not present

## 2017-09-24 DIAGNOSIS — N522 Drug-induced erectile dysfunction: Secondary | ICD-10-CM | POA: Diagnosis not present

## 2017-09-24 DIAGNOSIS — I10 Essential (primary) hypertension: Secondary | ICD-10-CM | POA: Diagnosis not present

## 2018-01-11 DIAGNOSIS — N522 Drug-induced erectile dysfunction: Secondary | ICD-10-CM | POA: Diagnosis not present

## 2018-01-11 DIAGNOSIS — Z23 Encounter for immunization: Secondary | ICD-10-CM | POA: Diagnosis not present

## 2018-01-11 DIAGNOSIS — E785 Hyperlipidemia, unspecified: Secondary | ICD-10-CM | POA: Diagnosis not present

## 2018-01-11 DIAGNOSIS — I10 Essential (primary) hypertension: Secondary | ICD-10-CM | POA: Diagnosis not present

## 2018-01-12 IMAGING — CT CT ABD-PELV W/ CM
3 of 5 series · 13 of 36 positions shown, 19 images · IV contrast (iopamidol)
Comparison: None.

CLINICAL DATA: Abdominal bloating.  Unintentional weight loss.

EXAM:
CT ABDOMEN AND PELVIS WITH CONTRAST
TECHNIQUE: Multidetector CT imaging of the abdomen and pelvis was performed
using the standard protocol following bolus administration of
intravenous contrast.
CONTRAST:  100mL [id number] IOPAMIDOL ([id number]) INJECTION 61%

[Series 3: abd/pelvis with · axial · 0.78mm/px · z∈[-404,-74]mm · 7 of 88 slices shown, 12 images]
[im 11/88  soft-tissue]
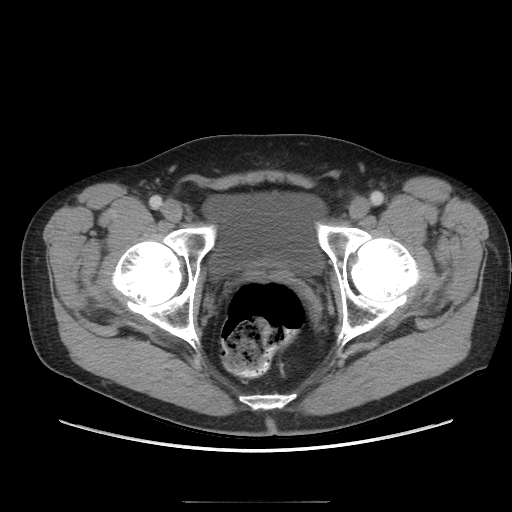
[im 11/88  bone]
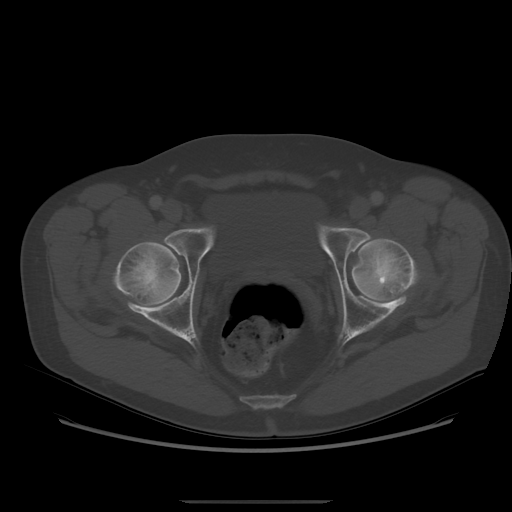
[im 22/88  soft-tissue]
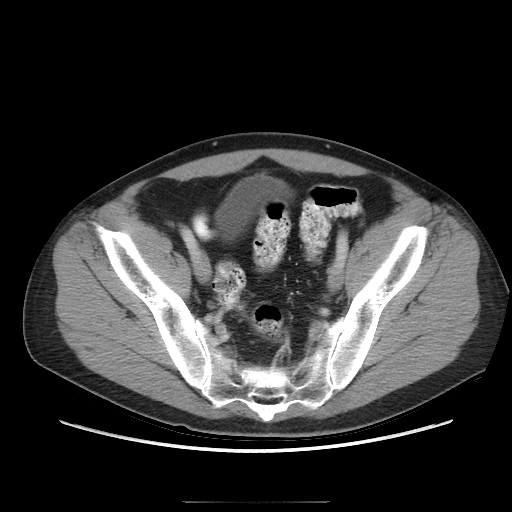
[im 33/88  soft-tissue]
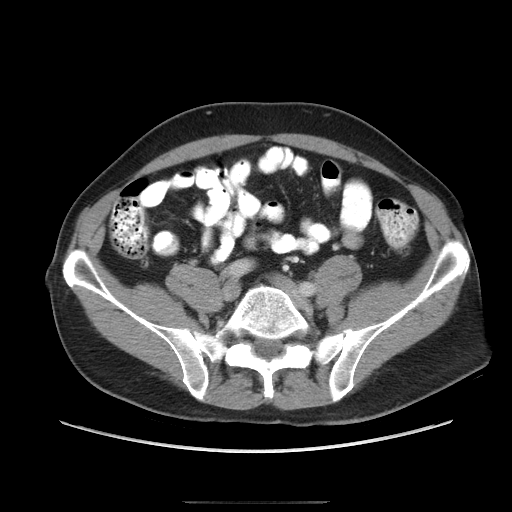
[im 44/88  soft-tissue]
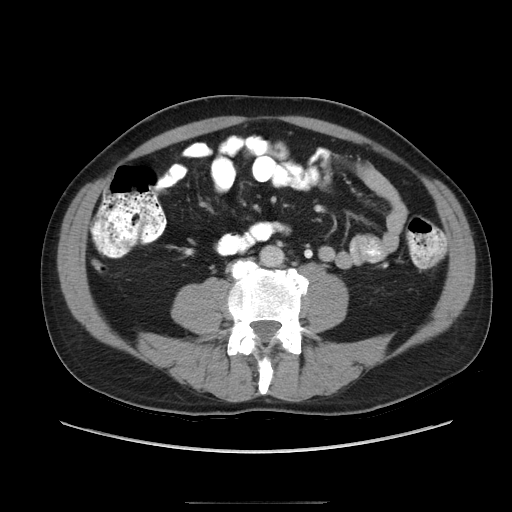
[im 44/88  lung]
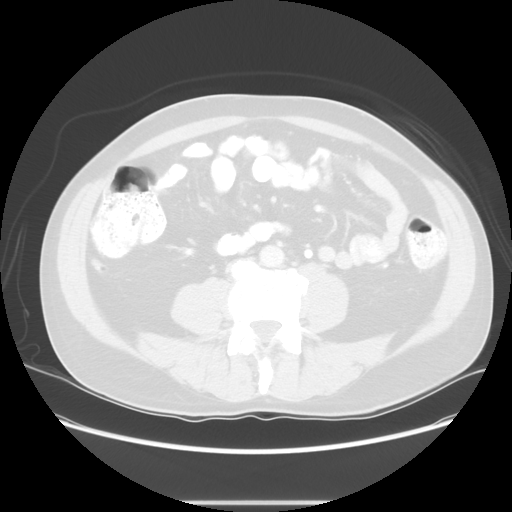
[im 55/88  soft-tissue]
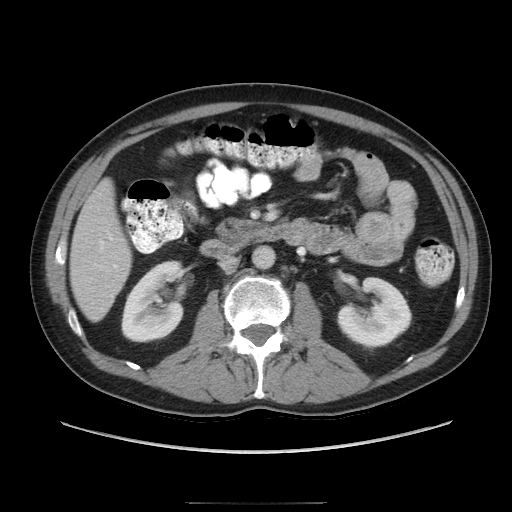
[im 55/88  lung]
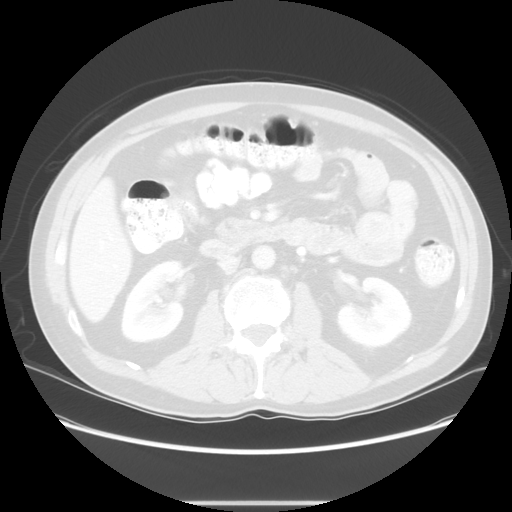
[im 66/88  soft-tissue]
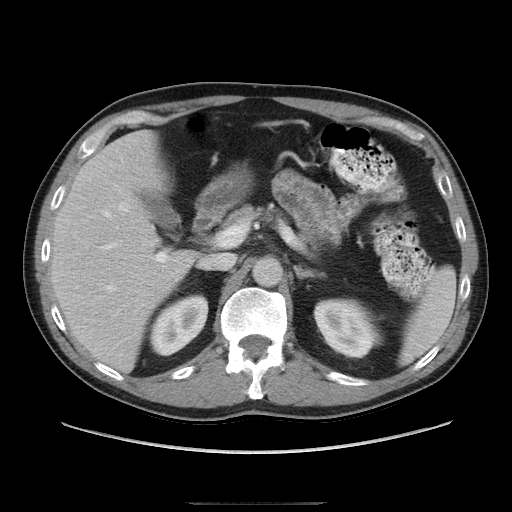
[im 66/88  lung]
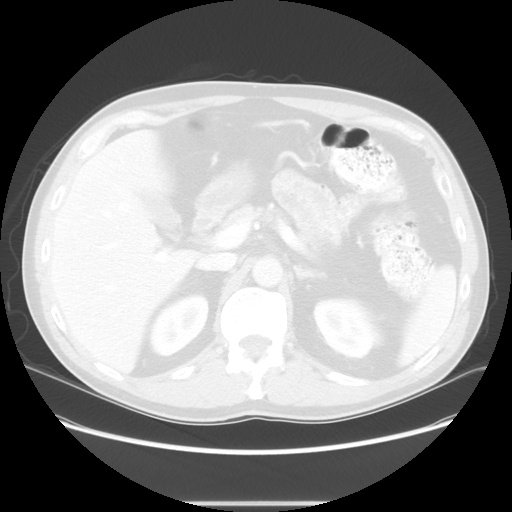
[im 77/88  soft-tissue]
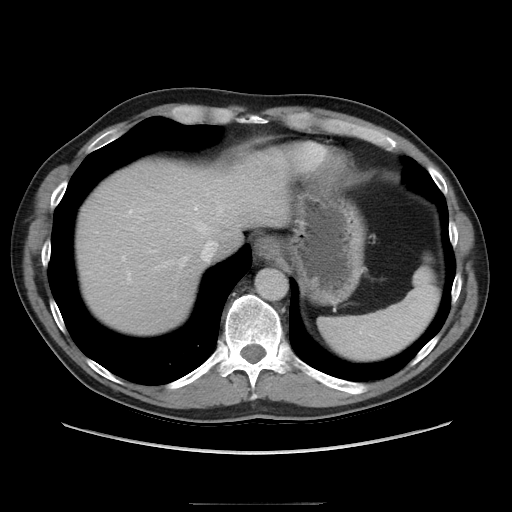
[im 77/88  lung]
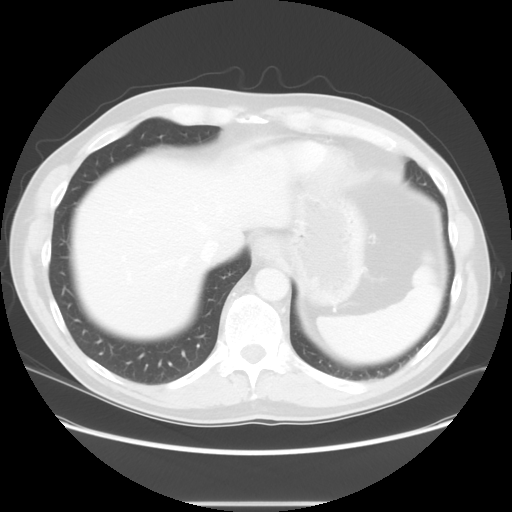

[Series 601: coronal body · coronal · 0.90mm/px · 1 of 113 slices shown, 2 images]
[im 38/113  soft-tissue]
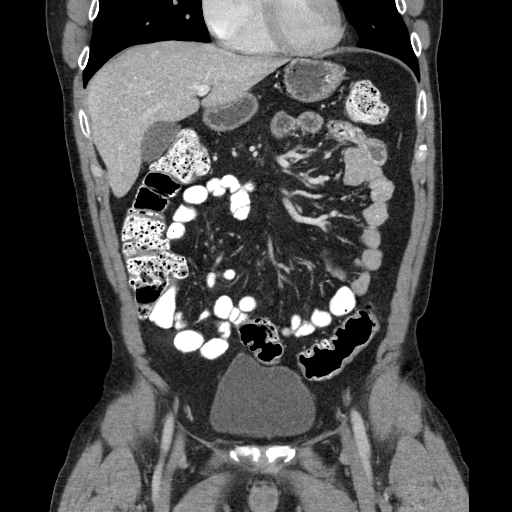
[im 38/113  bone]
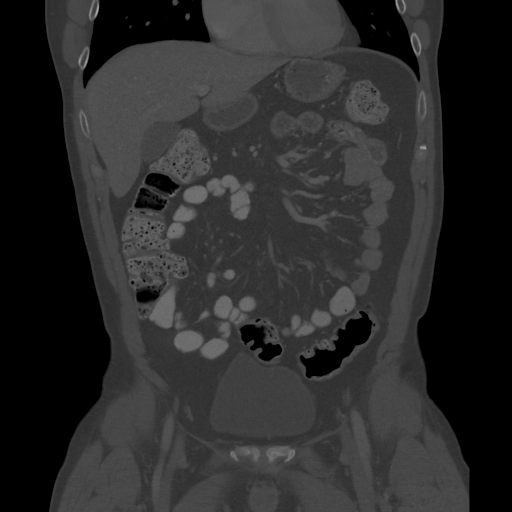

[Series 602: sagittal body · sagittal · 0.90mm/px · 5 of 159 slices shown]
[im 10/159  soft-tissue]
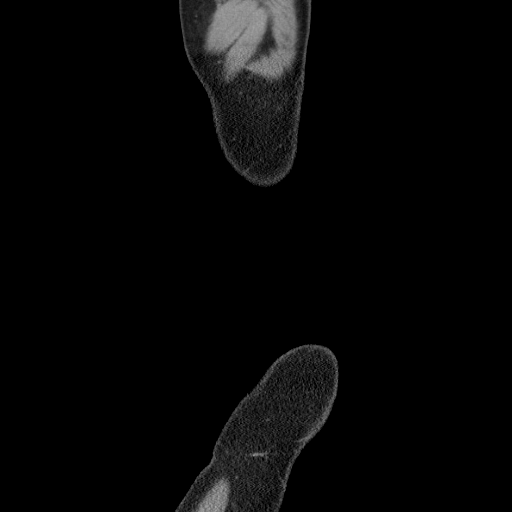
[im 30/159  soft-tissue]
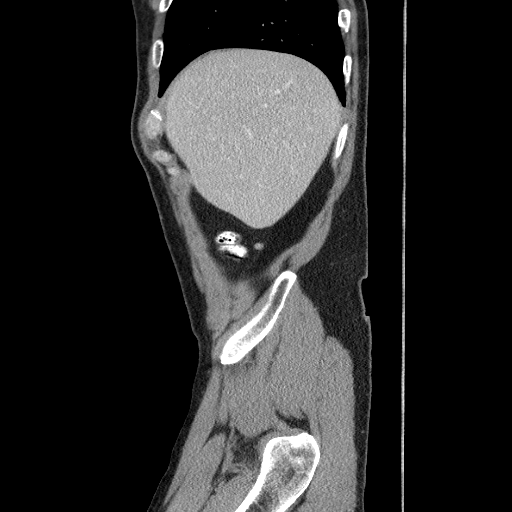
[im 50/159  soft-tissue]
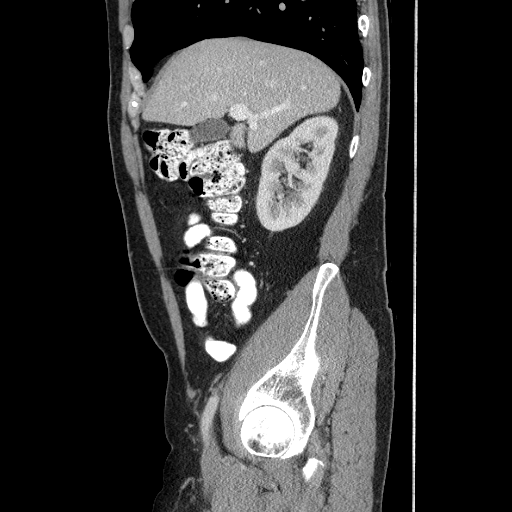
[im 70/159  soft-tissue]
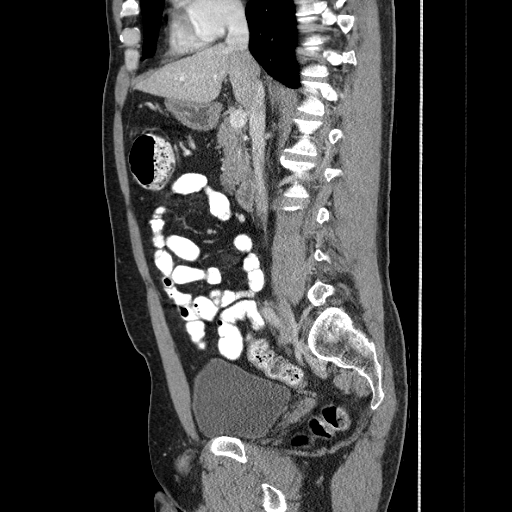
[im 89/159  soft-tissue]
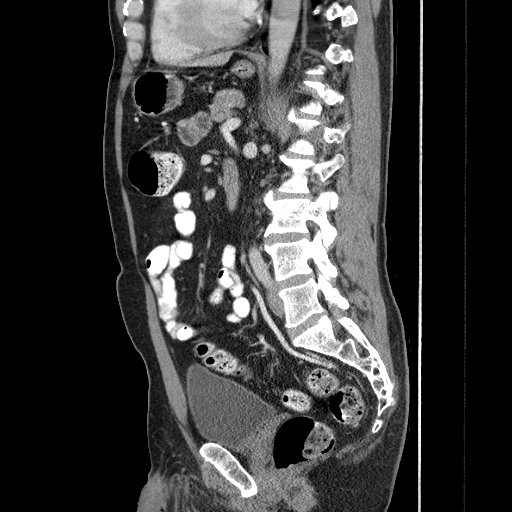

[13 of 36 positions shown; findings below may reference images not displayed]

FINDINGS: Lower chest: Normal.

Hepatobiliary: No focal liver abnormality is seen. No gallstones,
gallbladder wall thickening, or biliary dilatation.

Pancreas: Unremarkable. No pancreatic ductal dilatation or
surrounding inflammatory changes.

Spleen: Normal in size without focal abnormality.

Adrenals/Urinary Tract: Adrenal glands are unremarkable. Kidneys are
normal, without renal calculi, focal lesion, or hydronephrosis.
Bladder is unremarkable.

Stomach/Bowel: Stomach is within normal limits. Appendix appears
normal. No evidence of bowel wall thickening, distention, or
inflammatory changes.

Vascular/Lymphatic: No significant vascular findings are present. No
enlarged abdominal or pelvic lymph nodes.

Reproductive: Prostate is unremarkable.

Other: No abdominal wall hernia or abnormality. No abdominopelvic
ascites.

Musculoskeletal: No acute or significant osseous findings.
IMPRESSION: Essentially normal CT scan of the abdomen and pelvis.

## 2018-01-21 DIAGNOSIS — R7989 Other specified abnormal findings of blood chemistry: Secondary | ICD-10-CM | POA: Diagnosis not present

## 2018-01-21 DIAGNOSIS — E785 Hyperlipidemia, unspecified: Secondary | ICD-10-CM | POA: Diagnosis not present

## 2018-01-21 DIAGNOSIS — I10 Essential (primary) hypertension: Secondary | ICD-10-CM | POA: Diagnosis not present

## 2018-04-05 DIAGNOSIS — E785 Hyperlipidemia, unspecified: Secondary | ICD-10-CM | POA: Diagnosis not present

## 2018-04-05 DIAGNOSIS — I10 Essential (primary) hypertension: Secondary | ICD-10-CM | POA: Diagnosis not present

## 2018-04-05 DIAGNOSIS — K148 Other diseases of tongue: Secondary | ICD-10-CM | POA: Diagnosis not present

## 2018-04-05 DIAGNOSIS — Z79899 Other long term (current) drug therapy: Secondary | ICD-10-CM | POA: Diagnosis not present

## 2018-04-19 DIAGNOSIS — D101 Benign neoplasm of tongue: Secondary | ICD-10-CM | POA: Diagnosis not present

## 2018-04-19 DIAGNOSIS — K143 Hypertrophy of tongue papillae: Secondary | ICD-10-CM | POA: Diagnosis not present

## 2018-04-19 DIAGNOSIS — K148 Other diseases of tongue: Secondary | ICD-10-CM | POA: Diagnosis not present

## 2018-04-19 DIAGNOSIS — K1329 Other disturbances of oral epithelium, including tongue: Secondary | ICD-10-CM | POA: Diagnosis not present

## 2018-04-19 DIAGNOSIS — R438 Other disturbances of smell and taste: Secondary | ICD-10-CM | POA: Diagnosis not present

## 2018-04-19 DIAGNOSIS — Z7289 Other problems related to lifestyle: Secondary | ICD-10-CM | POA: Diagnosis not present

## 2018-05-03 DIAGNOSIS — Z79899 Other long term (current) drug therapy: Secondary | ICD-10-CM | POA: Diagnosis not present

## 2018-05-03 DIAGNOSIS — E785 Hyperlipidemia, unspecified: Secondary | ICD-10-CM | POA: Diagnosis not present

## 2018-05-03 DIAGNOSIS — N522 Drug-induced erectile dysfunction: Secondary | ICD-10-CM | POA: Diagnosis not present

## 2018-05-03 DIAGNOSIS — L0100 Impetigo, unspecified: Secondary | ICD-10-CM | POA: Diagnosis not present

## 2018-06-24 DIAGNOSIS — L905 Scar conditions and fibrosis of skin: Secondary | ICD-10-CM | POA: Diagnosis not present

## 2018-08-05 DIAGNOSIS — E291 Testicular hypofunction: Secondary | ICD-10-CM | POA: Diagnosis not present

## 2018-08-05 DIAGNOSIS — E785 Hyperlipidemia, unspecified: Secondary | ICD-10-CM | POA: Diagnosis not present

## 2018-08-05 DIAGNOSIS — N522 Drug-induced erectile dysfunction: Secondary | ICD-10-CM | POA: Diagnosis not present

## 2018-08-05 DIAGNOSIS — I10 Essential (primary) hypertension: Secondary | ICD-10-CM | POA: Diagnosis not present

## 2018-09-13 DIAGNOSIS — K648 Other hemorrhoids: Secondary | ICD-10-CM | POA: Diagnosis not present

## 2018-09-13 DIAGNOSIS — K594 Anal spasm: Secondary | ICD-10-CM | POA: Diagnosis not present

## 2018-10-07 DIAGNOSIS — I10 Essential (primary) hypertension: Secondary | ICD-10-CM | POA: Diagnosis not present

## 2018-10-07 DIAGNOSIS — Z79899 Other long term (current) drug therapy: Secondary | ICD-10-CM | POA: Diagnosis not present

## 2018-10-07 DIAGNOSIS — N522 Drug-induced erectile dysfunction: Secondary | ICD-10-CM | POA: Diagnosis not present

## 2018-10-07 DIAGNOSIS — E785 Hyperlipidemia, unspecified: Secondary | ICD-10-CM | POA: Diagnosis not present

## 2018-10-07 DIAGNOSIS — E291 Testicular hypofunction: Secondary | ICD-10-CM | POA: Diagnosis not present

## 2019-01-01 DIAGNOSIS — Z20828 Contact with and (suspected) exposure to other viral communicable diseases: Secondary | ICD-10-CM | POA: Diagnosis not present

## 2019-01-07 ENCOUNTER — Other Ambulatory Visit: Payer: Self-pay

## 2019-01-07 DIAGNOSIS — Z20828 Contact with and (suspected) exposure to other viral communicable diseases: Secondary | ICD-10-CM | POA: Diagnosis not present

## 2019-01-07 DIAGNOSIS — Z20822 Contact with and (suspected) exposure to covid-19: Secondary | ICD-10-CM

## 2019-01-09 LAB — NOVEL CORONAVIRUS, NAA: SARS-CoV-2, NAA: NOT DETECTED

## 2019-01-10 DIAGNOSIS — R519 Headache, unspecified: Secondary | ICD-10-CM | POA: Diagnosis not present

## 2019-01-10 DIAGNOSIS — I10 Essential (primary) hypertension: Secondary | ICD-10-CM | POA: Diagnosis not present

## 2019-01-10 DIAGNOSIS — N522 Drug-induced erectile dysfunction: Secondary | ICD-10-CM | POA: Diagnosis not present

## 2019-01-10 DIAGNOSIS — R5383 Other fatigue: Secondary | ICD-10-CM | POA: Diagnosis not present

## 2019-01-17 DIAGNOSIS — R5383 Other fatigue: Secondary | ICD-10-CM | POA: Diagnosis not present

## 2019-01-17 DIAGNOSIS — R7989 Other specified abnormal findings of blood chemistry: Secondary | ICD-10-CM | POA: Diagnosis not present

## 2019-01-17 DIAGNOSIS — R519 Headache, unspecified: Secondary | ICD-10-CM | POA: Diagnosis not present

## 2019-01-17 DIAGNOSIS — Z79899 Other long term (current) drug therapy: Secondary | ICD-10-CM | POA: Diagnosis not present

## 2019-03-21 DIAGNOSIS — Z03818 Encounter for observation for suspected exposure to other biological agents ruled out: Secondary | ICD-10-CM | POA: Diagnosis not present

## 2019-03-21 DIAGNOSIS — Z1159 Encounter for screening for other viral diseases: Secondary | ICD-10-CM | POA: Diagnosis not present

## 2019-04-21 DIAGNOSIS — N522 Drug-induced erectile dysfunction: Secondary | ICD-10-CM | POA: Diagnosis not present

## 2019-04-21 DIAGNOSIS — I10 Essential (primary) hypertension: Secondary | ICD-10-CM | POA: Diagnosis not present

## 2019-04-21 DIAGNOSIS — Z79899 Other long term (current) drug therapy: Secondary | ICD-10-CM | POA: Diagnosis not present

## 2019-04-21 DIAGNOSIS — E291 Testicular hypofunction: Secondary | ICD-10-CM | POA: Diagnosis not present

## 2019-04-24 DIAGNOSIS — Z1152 Encounter for screening for COVID-19: Secondary | ICD-10-CM | POA: Diagnosis not present

## 2019-04-24 DIAGNOSIS — R5381 Other malaise: Secondary | ICD-10-CM | POA: Diagnosis not present

## 2019-04-24 DIAGNOSIS — R519 Headache, unspecified: Secondary | ICD-10-CM | POA: Diagnosis not present

## 2019-06-13 DIAGNOSIS — Z03818 Encounter for observation for suspected exposure to other biological agents ruled out: Secondary | ICD-10-CM | POA: Diagnosis not present

## 2019-06-20 DIAGNOSIS — E785 Hyperlipidemia, unspecified: Secondary | ICD-10-CM | POA: Diagnosis not present

## 2019-06-20 DIAGNOSIS — R739 Hyperglycemia, unspecified: Secondary | ICD-10-CM | POA: Diagnosis not present

## 2019-06-20 DIAGNOSIS — N522 Drug-induced erectile dysfunction: Secondary | ICD-10-CM | POA: Diagnosis not present

## 2019-06-20 DIAGNOSIS — E291 Testicular hypofunction: Secondary | ICD-10-CM | POA: Diagnosis not present

## 2019-06-20 DIAGNOSIS — Z79899 Other long term (current) drug therapy: Secondary | ICD-10-CM | POA: Diagnosis not present

## 2019-06-23 DIAGNOSIS — E785 Hyperlipidemia, unspecified: Secondary | ICD-10-CM | POA: Diagnosis not present

## 2019-06-23 DIAGNOSIS — N522 Drug-induced erectile dysfunction: Secondary | ICD-10-CM | POA: Diagnosis not present

## 2019-06-23 DIAGNOSIS — I10 Essential (primary) hypertension: Secondary | ICD-10-CM | POA: Diagnosis not present

## 2019-06-23 DIAGNOSIS — E291 Testicular hypofunction: Secondary | ICD-10-CM | POA: Diagnosis not present

## 2019-11-03 DIAGNOSIS — R7989 Other specified abnormal findings of blood chemistry: Secondary | ICD-10-CM | POA: Diagnosis not present

## 2019-11-03 DIAGNOSIS — Z79899 Other long term (current) drug therapy: Secondary | ICD-10-CM | POA: Diagnosis not present

## 2019-11-03 DIAGNOSIS — E785 Hyperlipidemia, unspecified: Secondary | ICD-10-CM | POA: Diagnosis not present

## 2019-11-28 DIAGNOSIS — I10 Essential (primary) hypertension: Secondary | ICD-10-CM | POA: Diagnosis not present

## 2019-11-28 DIAGNOSIS — E785 Hyperlipidemia, unspecified: Secondary | ICD-10-CM | POA: Diagnosis not present

## 2019-11-28 DIAGNOSIS — L989 Disorder of the skin and subcutaneous tissue, unspecified: Secondary | ICD-10-CM | POA: Diagnosis not present

## 2019-11-28 DIAGNOSIS — Z79899 Other long term (current) drug therapy: Secondary | ICD-10-CM | POA: Diagnosis not present

## 2020-01-09 DIAGNOSIS — D1801 Hemangioma of skin and subcutaneous tissue: Secondary | ICD-10-CM | POA: Diagnosis not present

## 2020-01-09 DIAGNOSIS — L570 Actinic keratosis: Secondary | ICD-10-CM | POA: Diagnosis not present

## 2020-01-09 DIAGNOSIS — D225 Melanocytic nevi of trunk: Secondary | ICD-10-CM | POA: Diagnosis not present

## 2020-01-09 DIAGNOSIS — B078 Other viral warts: Secondary | ICD-10-CM | POA: Diagnosis not present

## 2020-01-09 DIAGNOSIS — L821 Other seborrheic keratosis: Secondary | ICD-10-CM | POA: Diagnosis not present

## 2020-03-01 DIAGNOSIS — K219 Gastro-esophageal reflux disease without esophagitis: Secondary | ICD-10-CM | POA: Diagnosis not present

## 2020-03-08 ENCOUNTER — Other Ambulatory Visit: Payer: Self-pay | Admitting: Family Medicine

## 2020-03-08 DIAGNOSIS — R1310 Dysphagia, unspecified: Secondary | ICD-10-CM

## 2020-03-19 DIAGNOSIS — R142 Eructation: Secondary | ICD-10-CM | POA: Diagnosis not present

## 2020-03-19 DIAGNOSIS — R0789 Other chest pain: Secondary | ICD-10-CM | POA: Diagnosis not present

## 2020-03-19 DIAGNOSIS — R1013 Epigastric pain: Secondary | ICD-10-CM | POA: Diagnosis not present

## 2020-03-22 ENCOUNTER — Emergency Department (HOSPITAL_COMMUNITY): Payer: BC Managed Care – PPO

## 2020-03-22 ENCOUNTER — Encounter (HOSPITAL_COMMUNITY): Payer: Self-pay

## 2020-03-22 ENCOUNTER — Emergency Department (HOSPITAL_COMMUNITY)
Admission: EM | Admit: 2020-03-22 | Discharge: 2020-03-22 | Disposition: A | Discharge status: Home / Self Care | Payer: BC Managed Care – PPO | Attending: Emergency Medicine | Admitting: Emergency Medicine

## 2020-03-22 DIAGNOSIS — Z7982 Long term (current) use of aspirin: Secondary | ICD-10-CM | POA: Insufficient documentation

## 2020-03-22 DIAGNOSIS — R109 Unspecified abdominal pain: Secondary | ICD-10-CM | POA: Diagnosis not present

## 2020-03-22 DIAGNOSIS — R1013 Epigastric pain: Secondary | ICD-10-CM | POA: Diagnosis not present

## 2020-03-22 LAB — COMPREHENSIVE METABOLIC PANEL
ALT: 22 U/L (ref 0–44)
AST: 18 U/L (ref 15–41)
Albumin: 4.3 g/dL (ref 3.5–5.0)
Alkaline Phosphatase: 68 U/L (ref 38–126)
Anion gap: 9 (ref 5–15)
BUN: 15 mg/dL (ref 6–20)
CO2: 27 mmol/L (ref 22–32)
Calcium: 8.9 mg/dL (ref 8.9–10.3)
Chloride: 101 mmol/L (ref 98–111)
Creatinine, Ser: 0.92 mg/dL (ref 0.61–1.24)
GFR, Estimated: >60 mL/min (ref >60)
Glucose, Bld: 105 mg/dL — ABNORMAL HIGH (ref 70–99)
Potassium: 4.3 mmol/L (ref 3.5–5.1)
Sodium: 137 mmol/L (ref 135–145)
Total Bilirubin: 0.7 mg/dL (ref 0.3–1.2)
Total Protein: 7.5 g/dL (ref 6.5–8.1)

## 2020-03-22 LAB — CBC
HCT: 42.8 % (ref 39.0–52.0)
Hemoglobin: 14.9 g/dL (ref 13.0–17.0)
MCH: 32.7 pg (ref 26.0–34.0)
MCHC: 34.8 g/dL (ref 30.0–36.0)
MCV: 93.9 fL (ref 80.0–100.0)
Platelets: 232 10*3/uL (ref 150–400)
RBC: 4.56 MIL/uL (ref 4.22–5.81)
RDW: 11.7 % (ref 11.5–15.5)
WBC: 5.1 10*3/uL (ref 4.0–10.5)
nRBC: 0 % (ref 0.0–0.2)

## 2020-03-22 LAB — LIPASE, BLOOD: Lipase: 30 U/L (ref 11–51)

## 2020-03-22 MED ORDER — ALUM & MAG HYDROXIDE-SIMETH 200-200-20 MG/5ML PO SUSP
30.0000 mL | Freq: Once | ORAL | Status: AC
  Administered 2020-03-22: 30 mL via ORAL
  Filled 2020-03-22: qty 30

## 2020-03-22 MED ORDER — PANTOPRAZOLE SODIUM 40 MG IV SOLR
40.0000 mg | Freq: Once | INTRAVENOUS | Status: DC
  Filled 2020-03-22: qty 40

## 2020-03-22 MED ORDER — SUCRALFATE 1 G PO TABS
1.0000 g | ORAL_TABLET | Freq: Once | ORAL | Status: AC
  Administered 2020-03-22: 09:00:00 1 g via ORAL
  Filled 2020-03-22: qty 1

## 2020-03-22 MED ORDER — LIDOCAINE VISCOUS HCL 2 % MT SOLN: 15.0000 mL | OROMUCOSAL | 0 refills | Status: AC | PRN

## 2020-03-22 MED ORDER — PANTOPRAZOLE SODIUM 40 MG PO TBEC
40.0000 mg | DELAYED_RELEASE_TABLET | Freq: Once | ORAL | Status: AC
  Administered 2020-03-22: 09:00:00 40 mg via ORAL
  Filled 2020-03-22: qty 1

## 2020-03-22 MED ORDER — SODIUM CHLORIDE 0.9 % IV BOLUS: 1000.0000 mL | Freq: Once | INTRAVENOUS | Status: DC

## 2020-03-22 MED ORDER — LIDOCAINE VISCOUS HCL 2 % MT SOLN
15.0000 mL | Freq: Once | OROMUCOSAL | Status: AC
  Administered 2020-03-22: 15 mL via ORAL
  Filled 2020-03-22: qty 15

## 2020-03-22 MED ORDER — PANTOPRAZOLE SODIUM 20 MG PO TBEC: 20.0000 mg | DELAYED_RELEASE_TABLET | Freq: Every day | ORAL | 0 refills | Status: DC

## 2020-03-22 NOTE — ED Triage Notes (Addendum)
Patient arrived with complaints of epigastric pain and reflux where stomach bile comes up his throat, states this has been happening since Thanksgiving and told if reoccurring to go to the ED. Taking anti-acid with no relief.

## 2020-03-22 NOTE — Discharge Instructions (Signed)
-  Keep your follow-up scheduled with the GI doctors as we discussed.   -Prescription sent to your pharmacy for the viscous lidocaine.  This will help with your sore throat.  Use only when needed.  -Prescription also sent for Protonix.  This is a medicine to help with heartburn.  Return to the emergency department for any new or worsening symptoms.

## 2020-03-22 NOTE — ED Notes (Signed)
Patient provided crackers, peanut butter, and cheese

## 2020-03-22 NOTE — ED Provider Notes (Signed)
[facility]-EMERGENCY DEPT Provider Note   CSN: [id number] Arrival date & time: 03/22/20  0645     History Chief Complaint  Patient presents with  . Abdominal Pain    Devon Whitaker is a 54 y.o. male with past medical history of palpitations, ectopic atrial rhythm.  No abdominal surgical history.  HPI Patient presents to emergency department today with chief complaint of intermittent epigastric abdominal pain x3 weeks.  Pain does not radiate.  He describes the pain as sharp.  He states he has been seen by his PCP for similar pain 3 times.  He has an appointment scheduled with [facility] next month however does not feel like he could wait for that appointment because of his worsening symptoms.  He states he typically has pain every other day.  He is unable to tolerate much p.o. intake because of the pain.  He states anytime he takes a bite of something will exacerbate his pain and cause him to vomit bile.  He states his throat is sore because of the constant emesis.  He states his pain right now is only 2 out of 10 in severity however yesterday it was unbearable.  He has tried taking Tums, Rolaids and Prilosec without any symptom improvement.  He states he has changed his diet drastically and cut out any coffee or alcohol.  Before that he only drank beer socially.  Does not smoke.  He last saw his PCP x2 days ago and was prescribed Carafate.  He has not yet picked it up from the pharmacy.  He denies any diaphoresis, fever, chills, shortness of breath, cough, hemoptysis, hematemesis chest pain, back pain, urinary symptoms, black tarry stool, blood in stool.  Denies any family cardiac history.    Past Medical History:  Diagnosis Date  . 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 Palpitations 01/07/2016  . Other chest pain 01/07/2016  . 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 No pertinent surgical  history.     No family history on file.  Social History   Tobacco Use  . Smoking status: Never Smoker  . Smokeless tobacco: Never Used  Substance Use Topics  . Alcohol use: Yes    Comment: social  . Drug use: No    Home Medications Prior to Admission medications   Medication Sig Start Date End Date Taking? Authorizing Provider  aspirin EC 81 MG tablet Take 1 tablet (81 mg total) by mouth daily. 01/07/16   Nahser, Deloris Ping, MD  lidocaine (XYLOCAINE) 2 % solution Use as directed 15 mLs in the mouth or throat as needed for up to 7 days for mouth pain. 03/22/20 03/29/20  Walisiewicz, Yvonna Alanis E, PA-C  pantoprazole (PROTONIX) 20 MG tablet Take 1 tablet (20 mg total) by mouth daily. 03/22/20 04/21/20  Shanon Ace, PA-C    Allergies    Patient has no known allergies.  Review of Systems   Review of Systems All other systems are reviewed and are negative for acute change except as noted in the HPI.  Physical Exam Updated Vital Signs BP (!) 146/78 (BP Location: Left Arm)   Pulse 73   Temp 98.2 F (36.8 C) (Oral)   Resp 16   SpO2 99%   Physical Exam Vitals and nursing note reviewed.  Constitutional:      General: He is not in acute distress.    Appearance: He is not ill-appearing.  HENT:     Head: Normocephalic and atraumatic.     Right Ear: Tympanic membrane and external ear normal.     Left Ear: Tympanic membrane and external ear normal.     Nose: Nose normal.     Mouth/Throat:     Mouth: Mucous membranes are moist.     Pharynx: Oropharynx is clear.  Eyes:     General: No scleral icterus.       Right eye: No discharge.        Left eye: No discharge.     Extraocular Movements: Extraocular movements intact.     Conjunctiva/sclera: Conjunctivae normal.     Pupils: Pupils are equal, round, and reactive to light.  Neck:     Vascular: No JVD.  Cardiovascular:     Rate and Rhythm: Normal rate and regular rhythm.     Pulses: Normal pulses.          Radial  pulses are 2+ on the right side and 2+ on the left side.     Heart sounds: Normal heart sounds.  Pulmonary:     Comments: Lungs clear to auscultation in all fields. Symmetric chest rise. No wheezing, rales, or rhonchi. Abdominal:     Tenderness: There is no right CVA tenderness or left CVA tenderness.     Comments: Abdomen is soft, non-distended.  Mild tenderness to palpation of epigastric area. No rigidity, no guarding. No peritoneal signs.  Musculoskeletal:        General: Normal range of motion.     Cervical back: Normal range of motion.  Skin:    General: Skin is warm and dry.     Capillary Refill: Capillary refill takes less than 2 seconds.  Neurological:     Mental Status: He is oriented to person, place, and time.     GCS: GCS eye subscore is 4. GCS verbal subscore is 5. GCS motor subscore is 6.     Comments: Fluent speech, no facial droop.  Psychiatric:        Behavior: Behavior normal.     ED Results / Procedures / Treatments   Labs (all labs ordered are listed, but only abnormal results are displayed) Labs Reviewed  COMPREHENSIVE METABOLIC PANEL - Abnormal; Notable for the following components:      Result Value   Glucose, Bld 105 (*)    All other components within normal limits  LIPASE, BLOOD  CBC    EKG EKG Interpretation  Date/Time:  Monday March 22 2020 07:21:59 EST Ventricular Rate:  61 PR Interval:  160 QRS Duration: 92 QT Interval:  386 QTC Calculation: 388 R Axis:   78 Text Interpretation: Normal sinus rhythm Normal ECG No significant change since last tracing Confirmed by Linwood Dibbles [phone]) on 03/22/2020 7:34:28 AM   Radiology US Abdomen Limited RUQ (LIVER/GB)  Result Date: 03/22/2020 CLINICAL DATA:  Upper abdominal pain EXAM: ULTRASOUND ABDOMEN LIMITED RIGHT UPPER QUADRANT COMPARISON:  Aug 25, 2016. FINDINGS: Gallbladder: No gallstones or wall thickening visualized. There is no pericholecystic fluid. No sonographic Murphy sign noted by  sonographer. Common bile duct: Diameter: 3 mm. No intrahepatic or extrahepatic biliary duct dilatation. Liver: No focal lesion identified. Within normal limits in parenchymal echogenicity. Portal vein is patent on color Doppler imaging with normal direction of blood flow towards the liver. Other: None. IMPRESSION: Study within normal limits. Electronically Signed   By: Bretta Bang III M.D.   On: 03/22/2020 08:36    Procedures Procedures (including critical care time)  Medications Ordered in ED Medications  alum & mag hydroxide-simeth (MAALOX/MYLANTA) 200-200-20 MG/5ML suspension 30 mL (30 mLs Oral Given 03/22/20 0823)    And  lidocaine (XYLOCAINE) 2 % viscous mouth solution 15 mL (15 mLs Oral Given 03/22/20 0824)  pantoprazole (PROTONIX) EC tablet 40 mg (40 mg Oral Given 03/22/20 0856)  sucralfate (CARAFATE) tablet 1 g (1 g Oral Given 03/22/20 5400)    ED Course  I have reviewed the triage vital signs and the nursing notes.  Pertinent labs & imaging results that were available during my care of the patient were reviewed by me and considered in my medical decision making (see chart for details).    MDM Rules/Calculators/A&P                         History provided by patient with additional history obtained from chart review.    Patient presents to the ED with complaints of abdominal pain. Patient nontoxic appearing, in no apparent distress, vitals WNL. On exam patient mildly tender to epigastric area, no peritoneal signs.  He does not appear dehydrated.  Will evaluate with labs and [place].  Patient given GI cocktail.  EKG shows normal sinus rhythm, no ischemic changes.  Labs reviewed and grossly unremarkable. No leukocytosis, no anemia, no significant electrolyte derangements. LFTs, renal function, and lipase WNL. He has no urinary symptoms, UA not needed.   Ultrasound is unremarkable, gallbladder and pancreas.  On reassessment patient admits symptoms have significantly improved.  Patient given PO Carafate and Protonix.  He was able to eat crackers, peanut butter and cheese without any discomfort or emesis. Drinking water.  On repeat abdominal exam patient remains without peritoneal signs, doubt cholecystitis, pancreatitis, diverticulitis, appendicitis, bowel obstruction/perforation. Will discharge home with supportive measures. Pain could be caused by GERD, gastric ulcer however no further indications for emergent workup in the hospital.  I discussed results, treatment plan, need for GI follow-up, and return precautions with the patient. Provided opportunity for questions, patient confirmed understanding and is in agreement with plan.   Portions of this note were generated with Scientist, clinical (histocompatibility and immunogenetics). Dictation errors may occur despite best attempts at proofreading.   Final Clinical Impression(s) / ED Diagnoses Final diagnoses:  Epigastric pain    Rx / DC Orders ED Discharge Orders         Ordered    lidocaine (XYLOCAINE) 2 % solution  As needed        03/22/20 0955    pantoprazole (PROTONIX) 20 MG tablet  Daily        03/22/20 0955           Shanon Ace, PA-C 03/22/20 1026    Linwood Dibbles, MD 03/23/20 [phone]

## 2020-03-24 ENCOUNTER — Other Ambulatory Visit: Payer: Self-pay

## 2020-03-24 ENCOUNTER — Ambulatory Visit (HOSPITAL_COMMUNITY)
Admission: RE | Admit: 2020-03-24 | Discharge: 2020-03-24 | Disposition: A | Discharge status: Home / Self Care | Payer: BC Managed Care – PPO | Source: Ambulatory Visit | Attending: Family Medicine | Admitting: Family Medicine

## 2020-03-24 DIAGNOSIS — R1310 Dysphagia, unspecified: Secondary | ICD-10-CM | POA: Diagnosis not present

## 2020-03-24 DIAGNOSIS — K224 Dyskinesia of esophagus: Secondary | ICD-10-CM | POA: Diagnosis not present

## 2020-03-31 DIAGNOSIS — R1110 Vomiting, unspecified: Secondary | ICD-10-CM | POA: Diagnosis not present

## 2020-03-31 DIAGNOSIS — K219 Gastro-esophageal reflux disease without esophagitis: Secondary | ICD-10-CM | POA: Diagnosis not present

## 2020-03-31 DIAGNOSIS — R1013 Epigastric pain: Secondary | ICD-10-CM | POA: Diagnosis not present

## 2020-04-09 ENCOUNTER — Other Ambulatory Visit: Payer: Self-pay | Admitting: Physician Assistant

## 2020-04-09 DIAGNOSIS — R1010 Upper abdominal pain, unspecified: Secondary | ICD-10-CM

## 2020-04-09 DIAGNOSIS — I10 Essential (primary) hypertension: Secondary | ICD-10-CM | POA: Diagnosis not present

## 2020-04-09 DIAGNOSIS — E785 Hyperlipidemia, unspecified: Secondary | ICD-10-CM | POA: Diagnosis not present

## 2020-04-09 DIAGNOSIS — K219 Gastro-esophageal reflux disease without esophagitis: Secondary | ICD-10-CM | POA: Diagnosis not present

## 2020-04-09 DIAGNOSIS — Z79899 Other long term (current) drug therapy: Secondary | ICD-10-CM | POA: Diagnosis not present

## 2020-04-20 ENCOUNTER — Ambulatory Visit
Admission: RE | Admit: 2020-04-20 | Discharge: 2020-04-20 | Disposition: A | Discharge status: Home / Self Care | Payer: BC Managed Care – PPO | Source: Ambulatory Visit | Attending: Physician Assistant | Admitting: Physician Assistant

## 2020-04-20 DIAGNOSIS — R1010 Upper abdominal pain, unspecified: Secondary | ICD-10-CM

## 2020-04-20 MED ORDER — IOPAMIDOL (ISOVUE-300) INJECTION 61%
100.0000 mL | Freq: Once | INTRAVENOUS | Status: AC | PRN
  Administered 2020-04-20: 100 mL via INTRAVENOUS

## 2020-04-26 DIAGNOSIS — Z01812 Encounter for preprocedural laboratory examination: Secondary | ICD-10-CM | POA: Diagnosis not present

## 2020-04-29 ENCOUNTER — Other Ambulatory Visit (HOSPITAL_COMMUNITY): Payer: Self-pay | Admitting: Gastroenterology

## 2020-04-29 ENCOUNTER — Other Ambulatory Visit: Payer: Self-pay | Admitting: Gastroenterology

## 2020-04-29 DIAGNOSIS — R112 Nausea with vomiting, unspecified: Secondary | ICD-10-CM | POA: Diagnosis not present

## 2020-04-29 DIAGNOSIS — R1010 Upper abdominal pain, unspecified: Secondary | ICD-10-CM | POA: Diagnosis not present

## 2020-05-07 ENCOUNTER — Ambulatory Visit (HOSPITAL_COMMUNITY)
Admission: RE | Admit: 2020-05-07 | Discharge: 2020-05-07 | Disposition: A | Discharge status: Home / Self Care | Payer: BC Managed Care – PPO | Source: Ambulatory Visit | Attending: Gastroenterology | Admitting: Gastroenterology

## 2020-05-07 ENCOUNTER — Other Ambulatory Visit: Payer: Self-pay

## 2020-05-07 DIAGNOSIS — R112 Nausea with vomiting, unspecified: Secondary | ICD-10-CM | POA: Insufficient documentation

## 2020-05-07 DIAGNOSIS — K219 Gastro-esophageal reflux disease without esophagitis: Secondary | ICD-10-CM | POA: Diagnosis not present

## 2020-05-07 MED ORDER — TECHNETIUM TC 99M SULFUR COLLOID
2.0000 | Freq: Once | INTRAVENOUS | Status: AC | PRN
  Administered 2020-05-07: 2 via INTRAVENOUS

## 2020-05-17 DIAGNOSIS — K219 Gastro-esophageal reflux disease without esophagitis: Secondary | ICD-10-CM | POA: Diagnosis not present

## 2020-05-17 DIAGNOSIS — Z1211 Encounter for screening for malignant neoplasm of colon: Secondary | ICD-10-CM | POA: Diagnosis not present

## 2020-06-03 DIAGNOSIS — Z01818 Encounter for other preprocedural examination: Secondary | ICD-10-CM | POA: Diagnosis not present

## 2020-06-03 DIAGNOSIS — K219 Gastro-esophageal reflux disease without esophagitis: Secondary | ICD-10-CM | POA: Diagnosis not present

## 2020-06-04 DIAGNOSIS — I10 Essential (primary) hypertension: Secondary | ICD-10-CM | POA: Diagnosis not present

## 2020-06-04 DIAGNOSIS — K219 Gastro-esophageal reflux disease without esophagitis: Secondary | ICD-10-CM | POA: Diagnosis not present

## 2020-06-04 DIAGNOSIS — Z79899 Other long term (current) drug therapy: Secondary | ICD-10-CM | POA: Diagnosis not present

## 2020-06-04 DIAGNOSIS — E785 Hyperlipidemia, unspecified: Secondary | ICD-10-CM | POA: Diagnosis not present

## 2020-06-24 DIAGNOSIS — K219 Gastro-esophageal reflux disease without esophagitis: Secondary | ICD-10-CM | POA: Diagnosis not present

## 2020-06-24 DIAGNOSIS — Z1211 Encounter for screening for malignant neoplasm of colon: Secondary | ICD-10-CM | POA: Diagnosis not present

## 2020-07-05 DIAGNOSIS — H524 Presbyopia: Secondary | ICD-10-CM | POA: Diagnosis not present

## 2020-07-05 DIAGNOSIS — H5203 Hypermetropia, bilateral: Secondary | ICD-10-CM | POA: Diagnosis not present

## 2020-07-05 DIAGNOSIS — H35033 Hypertensive retinopathy, bilateral: Secondary | ICD-10-CM | POA: Diagnosis not present

## 2020-09-03 ENCOUNTER — Other Ambulatory Visit: Payer: Self-pay | Admitting: Gastroenterology

## 2020-09-03 DIAGNOSIS — K219 Gastro-esophageal reflux disease without esophagitis: Secondary | ICD-10-CM | POA: Diagnosis not present

## 2020-10-18 DIAGNOSIS — I10 Essential (primary) hypertension: Secondary | ICD-10-CM | POA: Diagnosis not present

## 2020-10-18 DIAGNOSIS — K219 Gastro-esophageal reflux disease without esophagitis: Secondary | ICD-10-CM | POA: Diagnosis not present

## 2020-10-18 DIAGNOSIS — Z79899 Other long term (current) drug therapy: Secondary | ICD-10-CM | POA: Diagnosis not present

## 2020-10-18 DIAGNOSIS — R7989 Other specified abnormal findings of blood chemistry: Secondary | ICD-10-CM | POA: Diagnosis not present

## 2020-10-18 DIAGNOSIS — E785 Hyperlipidemia, unspecified: Secondary | ICD-10-CM | POA: Diagnosis not present

## 2020-12-21 ENCOUNTER — Other Ambulatory Visit: Payer: Self-pay | Admitting: Gastroenterology

## 2020-12-21 ENCOUNTER — Other Ambulatory Visit (HOSPITAL_COMMUNITY): Payer: Self-pay | Admitting: Gastroenterology

## 2020-12-21 DIAGNOSIS — K219 Gastro-esophageal reflux disease without esophagitis: Secondary | ICD-10-CM

## 2020-12-21 NOTE — Progress Notes (Signed)
Called patient to confirm mano and ph appointment  . Patient stated he had to cancel and would call office to reschedule.

## 2020-12-22 ENCOUNTER — Encounter (HOSPITAL_COMMUNITY): Admission: RE | Payer: Self-pay | Source: Ambulatory Visit

## 2020-12-22 ENCOUNTER — Ambulatory Visit (HOSPITAL_COMMUNITY)
Admission: RE | Admit: 2020-12-22 | Payer: BC Managed Care – PPO | Source: Ambulatory Visit | Admitting: Gastroenterology

## 2020-12-22 SURGERY — IMPEDANCE PH STUDY, ESOPHAGUS

## 2021-01-11 ENCOUNTER — Ambulatory Visit: Payer: BC Managed Care – PPO | Admitting: Cardiovascular Disease

## 2021-01-13 ENCOUNTER — Other Ambulatory Visit: Payer: Self-pay | Admitting: Family Medicine

## 2021-01-13 ENCOUNTER — Other Ambulatory Visit: Payer: Self-pay

## 2021-01-13 ENCOUNTER — Ambulatory Visit
Admission: RE | Admit: 2021-01-13 | Discharge: 2021-01-13 | Disposition: A | Discharge status: Home / Self Care | Payer: BC Managed Care – PPO | Source: Ambulatory Visit | Attending: Family Medicine | Admitting: Family Medicine

## 2021-01-13 DIAGNOSIS — R0789 Other chest pain: Secondary | ICD-10-CM

## 2021-01-13 DIAGNOSIS — R079 Chest pain, unspecified: Secondary | ICD-10-CM | POA: Diagnosis not present

## 2021-01-13 DIAGNOSIS — Z6826 Body mass index (BMI) 26.0-26.9, adult: Secondary | ICD-10-CM | POA: Diagnosis not present

## 2021-01-13 DIAGNOSIS — I10 Essential (primary) hypertension: Secondary | ICD-10-CM | POA: Diagnosis not present

## 2021-01-13 DIAGNOSIS — K2100 Gastro-esophageal reflux disease with esophagitis, without bleeding: Secondary | ICD-10-CM | POA: Diagnosis not present

## 2021-01-16 DIAGNOSIS — U071 COVID-19: Secondary | ICD-10-CM | POA: Diagnosis not present

## 2021-02-01 DIAGNOSIS — R1084 Generalized abdominal pain: Secondary | ICD-10-CM | POA: Diagnosis not present

## 2021-02-01 DIAGNOSIS — R1110 Vomiting, unspecified: Secondary | ICD-10-CM | POA: Diagnosis not present

## 2021-02-26 DIAGNOSIS — J101 Influenza due to other identified influenza virus with other respiratory manifestations: Secondary | ICD-10-CM | POA: Diagnosis not present

## 2021-02-27 ENCOUNTER — Encounter: Payer: Self-pay | Admitting: Cardiovascular Disease

## 2021-02-27 NOTE — Progress Notes (Signed)
This encounter was created in error - please disregard.

## 2021-02-28 ENCOUNTER — Encounter: Payer: BC Managed Care – PPO | Admitting: Cardiovascular Disease

## 2021-03-30 DIAGNOSIS — K219 Gastro-esophageal reflux disease without esophagitis: Secondary | ICD-10-CM | POA: Diagnosis not present

## 2021-03-30 DIAGNOSIS — K2289 Other specified disease of esophagus: Secondary | ICD-10-CM | POA: Diagnosis not present

## 2021-03-30 DIAGNOSIS — R079 Chest pain, unspecified: Secondary | ICD-10-CM | POA: Diagnosis not present

## 2021-03-30 DIAGNOSIS — R1111 Vomiting without nausea: Secondary | ICD-10-CM | POA: Diagnosis not present

## 2021-03-30 DIAGNOSIS — K222 Esophageal obstruction: Secondary | ICD-10-CM | POA: Diagnosis not present

## 2021-03-30 DIAGNOSIS — R12 Heartburn: Secondary | ICD-10-CM | POA: Diagnosis not present

## 2021-04-05 DIAGNOSIS — K219 Gastro-esophageal reflux disease without esophagitis: Secondary | ICD-10-CM | POA: Diagnosis not present

## 2021-04-06 ENCOUNTER — Ambulatory Visit: Payer: BC Managed Care – PPO | Admitting: Cardiovascular Disease

## 2021-04-11 DIAGNOSIS — E785 Hyperlipidemia, unspecified: Secondary | ICD-10-CM | POA: Diagnosis not present

## 2021-04-11 DIAGNOSIS — R0789 Other chest pain: Secondary | ICD-10-CM | POA: Diagnosis not present

## 2021-04-11 DIAGNOSIS — E291 Testicular hypofunction: Secondary | ICD-10-CM | POA: Diagnosis not present

## 2021-04-11 DIAGNOSIS — Z79899 Other long term (current) drug therapy: Secondary | ICD-10-CM | POA: Diagnosis not present

## 2021-04-19 DIAGNOSIS — E785 Hyperlipidemia, unspecified: Secondary | ICD-10-CM | POA: Diagnosis not present

## 2021-04-19 DIAGNOSIS — Z79899 Other long term (current) drug therapy: Secondary | ICD-10-CM | POA: Diagnosis not present

## 2021-04-19 DIAGNOSIS — E291 Testicular hypofunction: Secondary | ICD-10-CM | POA: Diagnosis not present

## 2021-06-16 DIAGNOSIS — Z79899 Other long term (current) drug therapy: Secondary | ICD-10-CM | POA: Diagnosis not present

## 2021-06-16 DIAGNOSIS — R0789 Other chest pain: Secondary | ICD-10-CM | POA: Diagnosis not present

## 2021-06-16 DIAGNOSIS — E291 Testicular hypofunction: Secondary | ICD-10-CM | POA: Diagnosis not present

## 2021-06-16 DIAGNOSIS — E785 Hyperlipidemia, unspecified: Secondary | ICD-10-CM | POA: Diagnosis not present

## 2021-07-07 ENCOUNTER — Ambulatory Visit: Payer: BC Managed Care – PPO | Admitting: Pulmonary Disease

## 2021-07-07 ENCOUNTER — Encounter: Payer: Self-pay | Admitting: Pulmonary Disease

## 2021-07-07 VITALS — BP 106/72 | HR 77 | Temp 98.0°F | Ht 72.0 in | Wt 197.8 lb

## 2021-07-07 DIAGNOSIS — K2100 Gastro-esophageal reflux disease with esophagitis, without bleeding: Secondary | ICD-10-CM | POA: Diagnosis not present

## 2021-07-07 DIAGNOSIS — R0789 Other chest pain: Secondary | ICD-10-CM
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
## 2021-07-07 NOTE — Patient Instructions (Signed)
Thank you for visiting Dr. Aharon Carriere at [facility]. Today we recommend the following:  Return if symptoms worsen or fail to improve.    Please do your part to reduce the spread of COVID-19.  

## 2021-07-18 ENCOUNTER — Ambulatory Visit: Payer: BC Managed Care – PPO | Admitting: Cardiovascular Disease

## 2021-07-18 ENCOUNTER — Encounter: Payer: Self-pay | Admitting: Cardiovascular Disease

## 2021-07-18 VITALS — BP 120/68 | HR 75 | Ht 72.0 in | Wt 201.4 lb

## 2021-07-18 DIAGNOSIS — R079 Chest pain, unspecified: Secondary | ICD-10-CM

## 2021-07-18 DIAGNOSIS — Z01812 Encounter for preprocedural laboratory examination: Secondary | ICD-10-CM | POA: Diagnosis not present

## 2021-07-18 LAB — BASIC METABOLIC PANEL
BUN/Creatinine Ratio: 14 (ref 9–20)
BUN: 13 mg/dL (ref 6–24)
CO2: 25 mmol/L (ref 20–29)
Calcium: 9.2 mg/dL (ref 8.7–10.2)
Chloride: 101 mmol/L (ref 96–106)
Creatinine, Ser: 0.95 mg/dL (ref 0.76–1.27)
Glucose: 104 mg/dL — ABNORMAL HIGH (ref 70–99)
Potassium: 4.6 mmol/L (ref 3.5–5.2)
Sodium: 140 mmol/L (ref 134–144)
eGFR: 93 mL/min/{1.73_m2} (ref >59)

## 2021-07-18 MED ORDER — METOPROLOL TARTRATE 100 MG PO TABS: 100.0000 mg | ORAL_TABLET | Freq: Once | ORAL | 0 refills | Status: DC
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
## 2021-07-18 NOTE — Patient Instructions (Addendum)
Medication Instructions:  ?Your physician recommends that you continue on your current medications as directed. Please refer to the Current Medication list given to you today. ? ?*If you need a refill on your cardiac medications before your next appointment, please call your pharmacy* ? ?Lab Work: ?TODAY: BMP ?If you have labs (blood work) drawn today and your tests are completely normal, you will receive your results only by: ?MyChart Message (if you have MyChart) OR ?A paper copy in the mail ?If you have any lab test that is abnormal or we need to change your treatment, we will call you to review the results. ? ?Testing/Procedures: ?Your physician has recommended you have a coronary CTA performed. You will be called to schedule this procedure. ? ?Follow-Up: ?At [facility], you and your health needs are our priority.  As part of our continuing mission to provide you with exceptional heart care, we have created designated Provider Care Teams.  These Care Teams include your primary Cardiologist (physician) and Advanced Practice Providers (APPs -  Physician Assistants and Nurse Practitioners) who all work together to provide you with the care you need, when you need it. ? ?Your next appointment:   ?As needed ? ?The format for your next appointment:   ?In Person ? ?Provider:   ?Mertie Moores, MD { ? ?Other Instructions ? ? ?Your cardiac CT will be scheduled at one of the below locations:  ? ?[facility] ?[address] ?[place],  [address] ?[phone] ? ?At [facility], please arrive at the [facility] and Children's Entrance (Entrance C2) of [facility] 30 minutes prior to test start time. ?You can use the FREE valet parking offered at entrance C (encouraged to control the heart rate for the test)  ?Proceed to the [facility] Radiology Department (first floor) to check-in and test prep. ? ?All radiology patients and guests should use entrance C2 at [facility], accessed  from [facility], even though the hospital's physical address listed is [address]. ? ? ? ?Please follow these instructions carefully (unless otherwise directed): ? ?Hold all erectile dysfunction medications at least 3 days (72 hrs) prior to test. ? ?On the Night Before the Test: ?Be sure to Drink plenty of water. ?Do not consume any caffeinated/decaffeinated beverages or chocolate 12 hours prior to your test. ?Do not take any antihistamines 12 hours prior to your test. ? ?On the Day of the Test: ?Drink plenty of water until 1 hour prior to the test. ?Do not eat any food 4 hours prior to the test. ?You may take your regular medications prior to the test.  ?Take metoprolol (Lopressor) 100mg  two hours prior to test. ? ?After the Test: ?Drink plenty of water. ?After receiving IV contrast, you may experience a mild flushed feeling. This is normal. ?On occasion, you may experience a mild rash up to 24 hours after the test. This is not dangerous. If this occurs, you can take Benadryl 25 mg and increase your fluid intake. ?If you experience trouble breathing, this can be serious. If it is severe call 911 IMMEDIATELY. If it is mild, please call our office. ?If you take any of these medications: Glipizide/Metformin, Avandament, Glucavance, please do not take 48 hours after completing test unless otherwise instructed. ? ?We will call to schedule your test 2-4 weeks out understanding that some insurance companies will need an authorization prior to the service being performed.  ? ?For non-scheduling related questions, please contact the cardiac  imaging nurse navigator should you have any questions/concerns: ?Marchia Bond, Cardiac Imaging Nurse Navigator ?[place]y Clement, Cardiac Imaging Nurse Navigator ?[facility] ?Direct Office Dial: [phone]  ? ?For scheduling needs, including cancellations and rescheduling, please call [place], [phone].  ? ? ?Important Information  About [employer] ? ? ? ? ? - ?

## 2021-07-29 ENCOUNTER — Telehealth (HOSPITAL_COMMUNITY): Payer: Self-pay | Admitting: *Deleted

## 2021-07-29 NOTE — Telephone Encounter (Signed)
Reaching out to patient to offer assistance regarding upcoming cardiac imaging study; pt verbalizes understanding of appt date/time, parking situation and where to check in, pre-test NPO status and medications ordered, and verified current allergies; name and call back number provided for further questions should they arise  Semir Brill RN Navigator Cardiac Imaging [facility] [phone] office [phone] cell  Patient to take 100mg metoprolol tartrate two hours prior to his cardiac CT scan. He is aware to arrive at 8am. 

## 2021-08-02 ENCOUNTER — Encounter (HOSPITAL_COMMUNITY): Payer: Self-pay

## 2021-08-02 ENCOUNTER — Ambulatory Visit (HOSPITAL_COMMUNITY)
Admission: RE | Admit: 2021-08-02 | Discharge: 2021-08-02 | Disposition: A | Discharge status: Home / Self Care | Payer: BC Managed Care – PPO | Source: Ambulatory Visit | Attending: Cardiovascular Disease | Admitting: Cardiovascular Disease

## 2021-08-02 DIAGNOSIS — R079 Chest pain, unspecified: Secondary | ICD-10-CM | POA: Diagnosis not present

## 2021-08-02 DIAGNOSIS — I2510 Atherosclerotic heart disease of native coronary artery without angina pectoris: Secondary | ICD-10-CM | POA: Diagnosis not present

## 2021-08-02 MED ORDER — IOHEXOL 350 MG/ML SOLN
100.0000 mL | Freq: Once | INTRAVENOUS | Status: AC | PRN
  Administered 2021-08-02: 100 mL via INTRAVENOUS

## 2021-08-02 MED ORDER — NITROGLYCERIN 0.4 MG SL SUBL
SUBLINGUAL_TABLET | SUBLINGUAL | Status: AC
  Filled 2021-08-02: qty 2

## 2021-08-02 MED ORDER — NITROGLYCERIN 0.4 MG SL SUBL
0.8000 mg | SUBLINGUAL_TABLET | Freq: Once | SUBLINGUAL | Status: AC
Start: 2021-08-02 — End: 2021-08-02
  Administered 2021-08-02: 0.8 mg via SUBLINGUAL

## 2021-08-03 ENCOUNTER — Telehealth: Payer: Self-pay

## 2021-08-03 DIAGNOSIS — Z79899 Other long term (current) drug therapy: Secondary | ICD-10-CM

## 2021-08-03 MED ORDER — ROSUVASTATIN CALCIUM 20 MG PO TABS: 20.0000 mg | ORAL_TABLET | Freq: Every day | ORAL | 3 refills | Status: DC

## 2021-08-03 NOTE — Telephone Encounter (Signed)
Spoke with patient who is agreeable with starting Rosuvastatin. Repeat labs scheduled for 11/04/21. ?

## 2021-08-03 NOTE — Telephone Encounter (Signed)
-----   Message from Vesta Mixer, MD sent at 08/02/2021  4:03 PM EDT ----- ?Lets start Rosuvastatin 20 mg a day  ?Check lipids , ALT, BMP in 3 months  ? ?Follow up with [place] in 1 year  ?

## 2021-09-23 DIAGNOSIS — K222 Esophageal obstruction: Secondary | ICD-10-CM | POA: Diagnosis not present

## 2021-09-23 DIAGNOSIS — K219 Gastro-esophageal reflux disease without esophagitis: Secondary | ICD-10-CM | POA: Diagnosis not present

## 2021-11-04 ENCOUNTER — Other Ambulatory Visit: Payer: BC Managed Care – PPO

## 2021-11-04 DIAGNOSIS — Z79899 Other long term (current) drug therapy: Secondary | ICD-10-CM

## 2021-11-04 LAB — LIPID PANEL
Chol/HDL Ratio: 2.2 ratio (ref 0.0–5.0)
Cholesterol, Total: 129 mg/dL (ref 100–199)
HDL: 58 mg/dL (ref >39)
LDL Chol Calc (NIH): 55 mg/dL (ref 0–99)
Triglycerides: 83 mg/dL (ref 0–149)
VLDL Cholesterol Cal: 16 mg/dL (ref 5–40)

## 2021-11-04 LAB — BASIC METABOLIC PANEL
BUN/Creatinine Ratio: 11 (ref 9–20)
BUN: 10 mg/dL (ref 6–24)
CO2: 25 mmol/L (ref 20–29)
Calcium: 9.8 mg/dL (ref 8.7–10.2)
Chloride: 101 mmol/L (ref 96–106)
Creatinine, Ser: 0.94 mg/dL (ref 0.76–1.27)
Glucose: 89 mg/dL (ref 70–99)
Potassium: 4.2 mmol/L (ref 3.5–5.2)
Sodium: 140 mmol/L (ref 134–144)
eGFR: 94 mL/min/{1.73_m2} (ref >59)

## 2021-11-04 LAB — ALT: ALT: 36 IU/L (ref 0–44)

## 2021-12-07 DIAGNOSIS — K3189 Other diseases of stomach and duodenum: Secondary | ICD-10-CM | POA: Diagnosis not present

## 2021-12-07 DIAGNOSIS — K222 Esophageal obstruction: Secondary | ICD-10-CM | POA: Diagnosis not present

## 2021-12-07 DIAGNOSIS — R1310 Dysphagia, unspecified: Secondary | ICD-10-CM | POA: Diagnosis not present

## 2021-12-07 DIAGNOSIS — K219 Gastro-esophageal reflux disease without esophagitis: Secondary | ICD-10-CM | POA: Diagnosis not present

## 2021-12-07 DIAGNOSIS — K2950 Unspecified chronic gastritis without bleeding: Secondary | ICD-10-CM | POA: Diagnosis not present

## 2021-12-13 DIAGNOSIS — K219 Gastro-esophageal reflux disease without esophagitis: Secondary | ICD-10-CM | POA: Diagnosis not present

## 2021-12-13 DIAGNOSIS — R0789 Other chest pain: Secondary | ICD-10-CM | POA: Diagnosis not present

## 2021-12-15 DIAGNOSIS — G44201 Tension-type headache, unspecified, intractable: Secondary | ICD-10-CM | POA: Diagnosis not present

## 2021-12-21 DIAGNOSIS — L218 Other seborrheic dermatitis: Secondary | ICD-10-CM | POA: Diagnosis not present

## 2021-12-21 DIAGNOSIS — L821 Other seborrheic keratosis: Secondary | ICD-10-CM | POA: Diagnosis not present

## 2022-02-27 ENCOUNTER — Ambulatory Visit: Payer: BC Managed Care – PPO | Admitting: Psychiatry

## 2022-03-03 DIAGNOSIS — K219 Gastro-esophageal reflux disease without esophagitis: Secondary | ICD-10-CM | POA: Diagnosis not present
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
## 2022-04-13 ENCOUNTER — Ambulatory Visit: Payer: BC Managed Care – PPO | Admitting: Psychiatry

## 2022-04-13 ENCOUNTER — Encounter: Payer: Self-pay | Admitting: Psychiatry

## 2022-04-13 VITALS — BP 121/60 | HR 87 | Ht 72.0 in | Wt 204.5 lb

## 2022-04-13 DIAGNOSIS — G4452 New daily persistent headache (NDPH): Secondary | ICD-10-CM

## 2022-04-13 DIAGNOSIS — R519 Headache, unspecified: Secondary | ICD-10-CM | POA: Diagnosis not present

## 2022-04-13 NOTE — Patient Instructions (Addendum)
Plan: -Brain MRI -Blood work to look for inflammation  Natural supplements that can reduce headaches: Magnesium Oxide or Magnesium Glycinate 500 mg at bed (up to 800 mg daily) Coenzyme Q10 300 mg in AM Vitamin B2- 200 mg twice a day  Add 1 supplement at a time since even natural supplements can have undesirable side effects. You can sometimes buy supplements cheaper (especially Coenzyme Q10) at www.https://compton-perez.com/ or at [employer].  Magnesium: Magnesium (250 mg twice a day or 500 mg at bed) has a relaxant effect on smooth muscles such as blood vessels. Individuals suffering from frequent or daily headache usually have low magnesium levels which can be increase with daily supplementation of 400-800 mg. Three trials found 40-90% average headache reduction  when used as a preventative.  Magnesium is part of the messenger system in the serotonin cascade and it is a good muscle relaxant.  It is also useful for constipation. Good sources include nuts, whole grains, and tomatoes. Side Effects: loose stool/diarrhea  Riboflavin (vitamin B 2) 200 mg twice a day. This vitamin assists nerve cells in the production of ATP a principal energy storing molecule.  It is necessary for many chemical reactions in the body.  There have been at least 3 clinical trials of riboflavin using 400 mg per day all of which suggested that headache frequency can be decreased. The supplement is found in bread, cereal, milk, meat, and poultry.  Most Americans get more riboflavin than the recommended daily allowance, however riboflavin deficiency is not necessary for the supplements to help prevent headache. Side effects: energizing, green urine  Coenzyme Q10: This is present in almost all cells in the body and is critical component for the conversion of energy.  Recent studies have shown that a nutritional supplement of CoQ10 can reduce the frequency of headache attacks by improving the energy production of cells as with riboflavin.  Doses of  150 mg twice a day have been shown to be effective.

## 2022-04-14 LAB — C-REACTIVE PROTEIN: CRP: <1 mg/L (ref 0–10)

## 2022-04-14 LAB — SEDIMENTATION RATE: Sed Rate: 4 mm/hr (ref 0–30)

## 2022-04-19 ENCOUNTER — Ambulatory Visit: Payer: BC Managed Care – PPO

## 2022-04-19 DIAGNOSIS — G4452 New daily persistent headache (NDPH): Secondary | ICD-10-CM

## 2022-04-19 MED ORDER — GADOBENATE DIMEGLUMINE 529 MG/ML IV SOLN
20.0000 mL | Freq: Once | INTRAVENOUS | Status: AC | PRN
  Administered 2022-04-19: 20 mL via INTRAVENOUS

## 2022-04-20 DIAGNOSIS — H35033 Hypertensive retinopathy, bilateral: Secondary | ICD-10-CM | POA: Diagnosis not present

## 2022-04-20 DIAGNOSIS — H3561 Retinal hemorrhage, right eye: Secondary | ICD-10-CM | POA: Diagnosis not present

## 2022-04-21 DIAGNOSIS — E291 Testicular hypofunction: Secondary | ICD-10-CM | POA: Diagnosis not present

## 2022-04-21 DIAGNOSIS — Z79899 Other long term (current) drug therapy: Secondary | ICD-10-CM | POA: Diagnosis not present

## 2022-04-27 DIAGNOSIS — I10 Essential (primary) hypertension: Secondary | ICD-10-CM | POA: Diagnosis not present

## 2022-04-27 DIAGNOSIS — Z0000 Encounter for general adult medical examination without abnormal findings: Secondary | ICD-10-CM | POA: Diagnosis not present

## 2022-04-27 DIAGNOSIS — E785 Hyperlipidemia, unspecified: Secondary | ICD-10-CM | POA: Diagnosis not present

## 2022-04-27 DIAGNOSIS — N529 Male erectile dysfunction, unspecified: Secondary | ICD-10-CM | POA: Diagnosis not present

## 2022-04-27 DIAGNOSIS — E291 Testicular hypofunction: Secondary | ICD-10-CM | POA: Diagnosis not present

## 2022-04-27 DIAGNOSIS — Z23 Encounter for immunization: Secondary | ICD-10-CM | POA: Diagnosis not present

## 2022-05-18 DIAGNOSIS — K219 Gastro-esophageal reflux disease without esophagitis: Secondary | ICD-10-CM | POA: Diagnosis not present

## 2022-06-02 IMAGING — CR DG CHEST 2V
2 series · 2 of 2 positions shown · non-contrast
Comparison: 05/25/2014

CLINICAL DATA: 55-year-old male with burning chest pain

EXAM:
CHEST - 2 VIEW

[w chest pa]
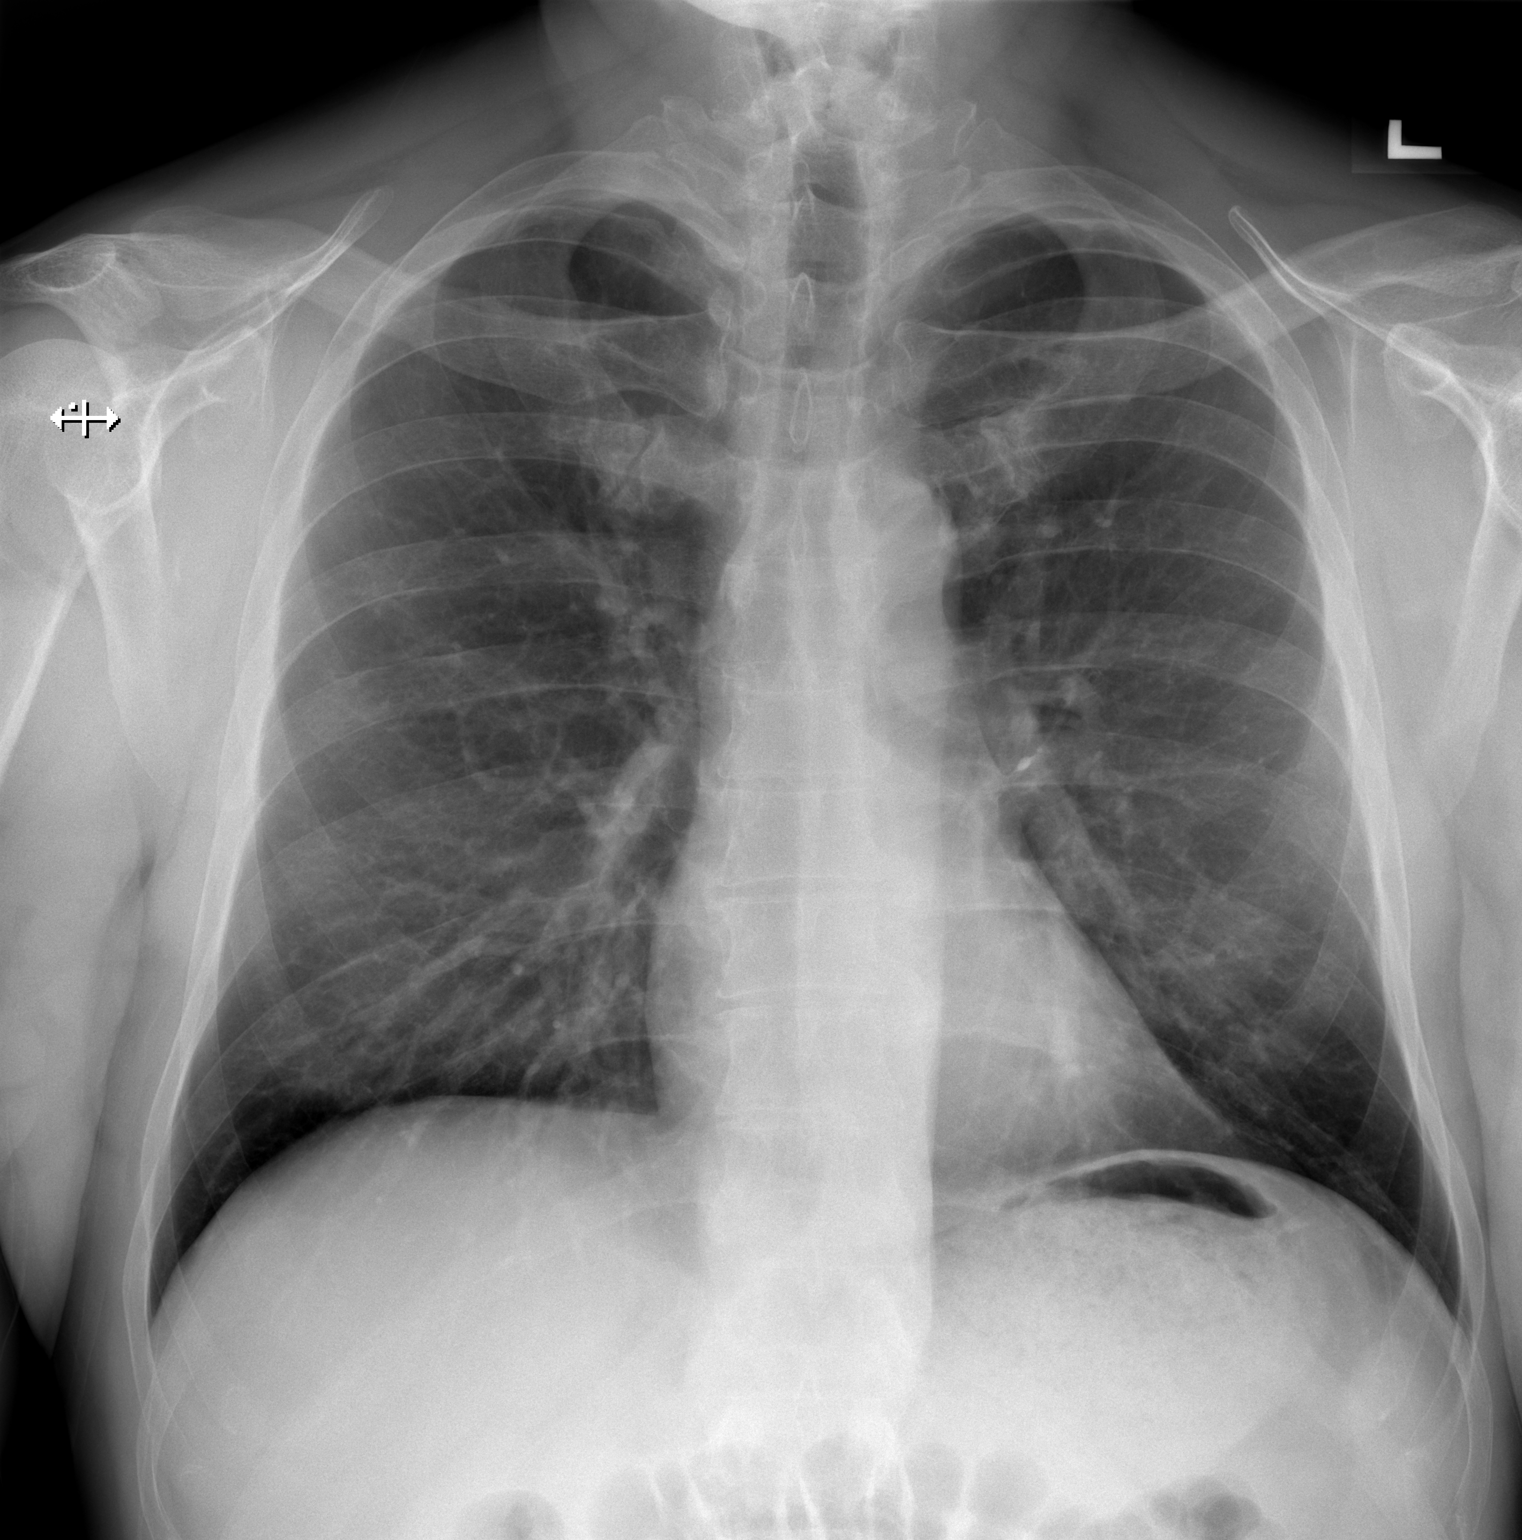

[w chest lat]
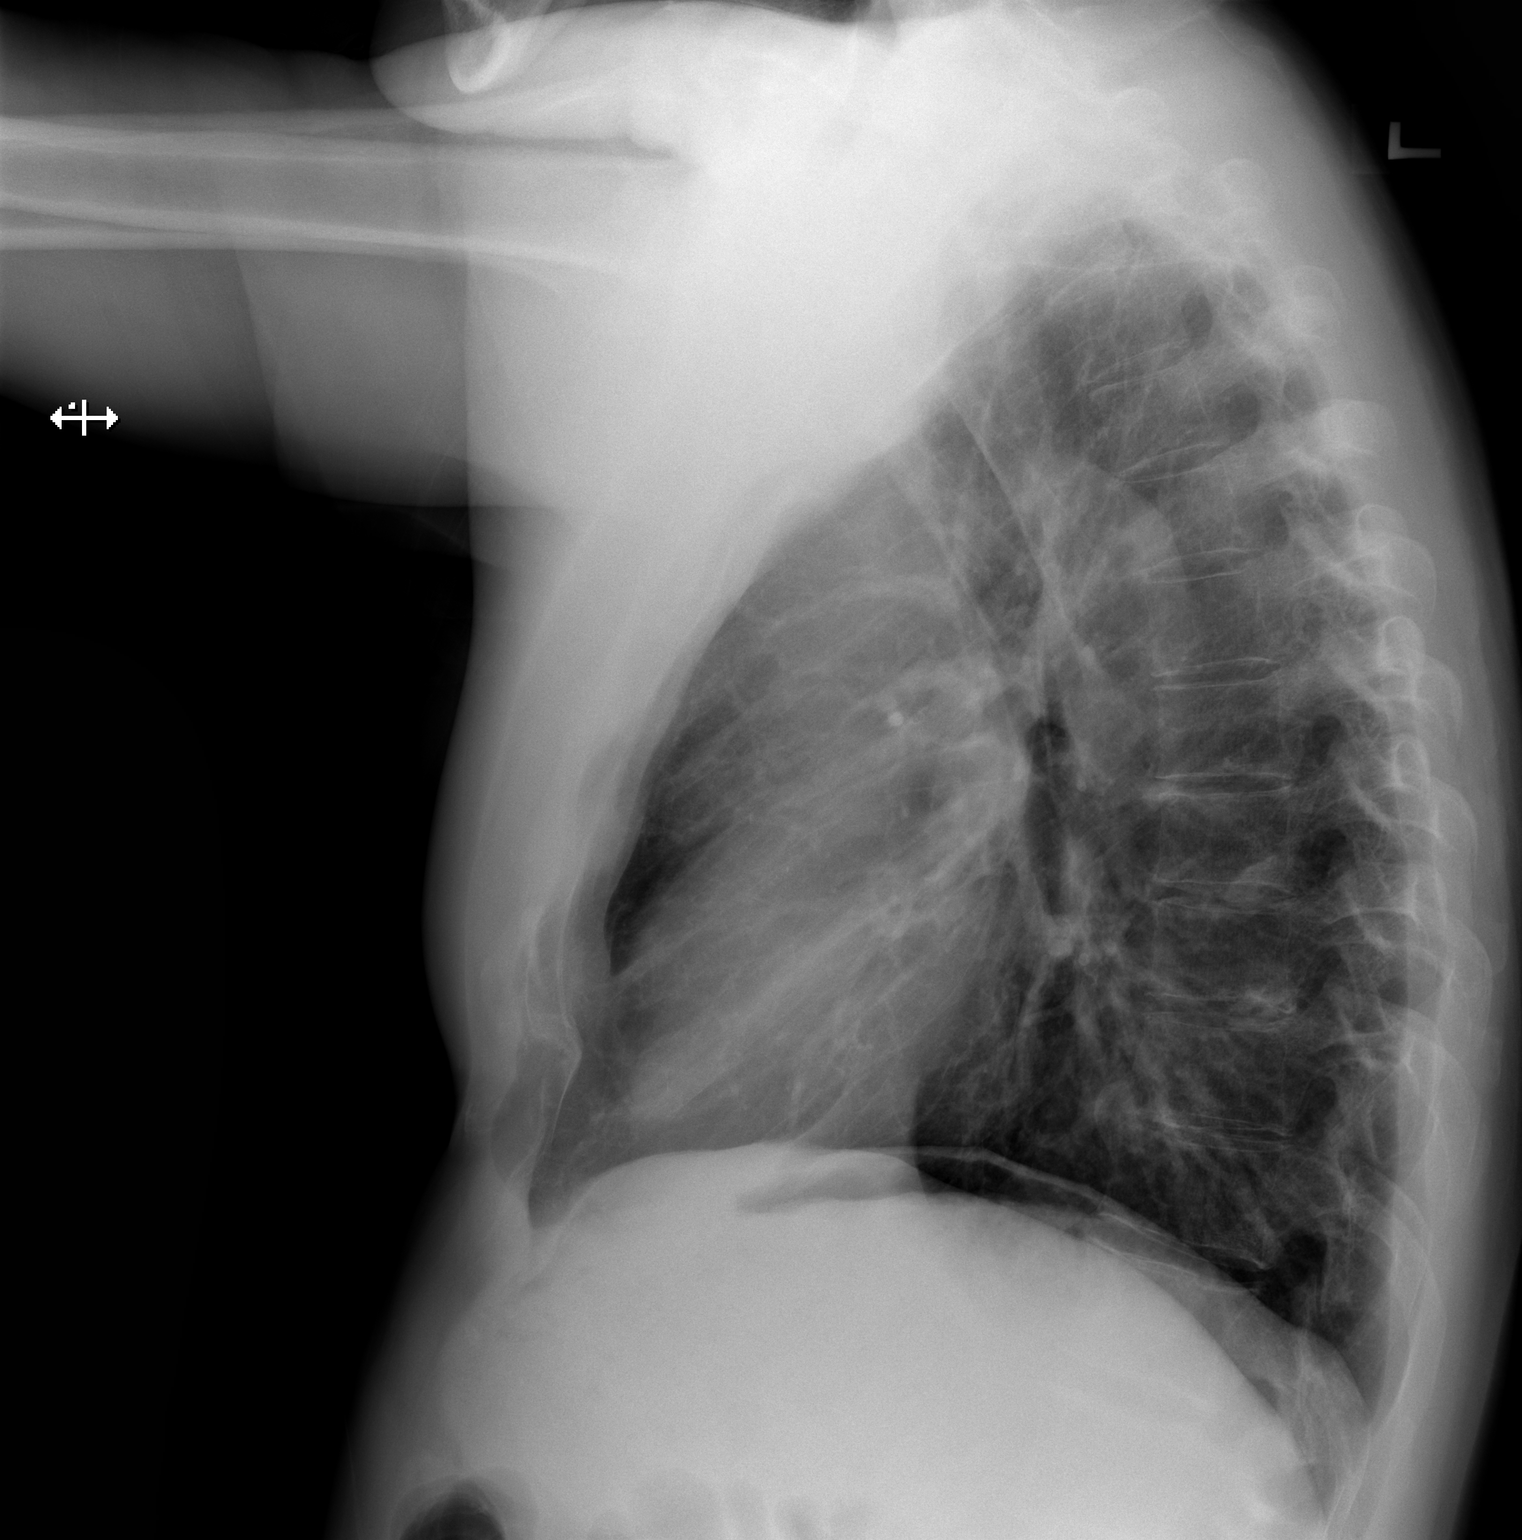

[2 of 2 positions shown; findings below may reference images not displayed]

FINDINGS: Cardiomediastinal silhouette unchanged in size and contour. No
evidence of central vascular congestion. No interlobular septal
thickening.

No pneumothorax or pleural effusion. Coarsened interstitial
markings, with no confluent airspace disease.

No acute displaced fracture. Degenerative changes of the spine.
IMPRESSION: No active cardiopulmonary disease.

## 2022-06-27 DIAGNOSIS — E291 Testicular hypofunction: Secondary | ICD-10-CM | POA: Diagnosis not present

## 2022-06-27 DIAGNOSIS — Z23 Encounter for immunization: Secondary | ICD-10-CM | POA: Diagnosis not present

## 2022-06-27 DIAGNOSIS — N529 Male erectile dysfunction, unspecified: Secondary | ICD-10-CM | POA: Diagnosis not present

## 2022-07-12 ENCOUNTER — Encounter: Payer: BC Managed Care – PPO | Admitting: Urology

## 2022-07-26 ENCOUNTER — Ambulatory Visit: Payer: BC Managed Care – PPO | Admitting: Urology

## 2022-07-26 ENCOUNTER — Encounter: Payer: Self-pay | Admitting: Urology

## 2022-07-26 VITALS — BP 137/85 | HR 60 | Ht 72.0 in | Wt 200.0 lb

## 2022-07-26 DIAGNOSIS — N529 Male erectile dysfunction, unspecified: Secondary | ICD-10-CM | POA: Diagnosis not present

## 2022-07-26 DIAGNOSIS — E291 Testicular hypofunction: Secondary | ICD-10-CM

## 2022-07-26 MED ORDER — TESTOSTERONE 20.25 MG/ACT (1.62%) TD GEL
4.0000 | Freq: Every day | TRANSDERMAL | 5 refills | Status: AC
Start: 2022-07-26 — End: ?
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
## 2022-08-25 ENCOUNTER — Ambulatory Visit: Payer: BC Managed Care – PPO | Admitting: Urology

## 2022-09-06 ENCOUNTER — Other Ambulatory Visit: Payer: Self-pay | Admitting: Cardiovascular Disease

## 2022-09-08 ENCOUNTER — Ambulatory Visit: Payer: BC Managed Care – PPO | Admitting: Urology

## 2022-09-08 ENCOUNTER — Encounter: Payer: Self-pay | Admitting: Urology

## 2022-09-08 VITALS — BP 108/60 | HR 75 | Ht 72.0 in | Wt 198.0 lb

## 2022-09-08 DIAGNOSIS — E291 Testicular hypofunction: Secondary | ICD-10-CM | POA: Diagnosis not present

## 2022-09-08 DIAGNOSIS — N529 Male erectile dysfunction, unspecified: Secondary | ICD-10-CM
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
## 2022-09-09 LAB — CBC
Hematocrit: 40.8 % (ref 37.5–51.0)
Hemoglobin: 14 g/dL (ref 13.0–17.7)
MCH: 32.3 pg (ref 26.6–33.0)
MCHC: 34.3 g/dL (ref 31.5–35.7)
MCV: 94 fL (ref 79–97)
Platelets: 206 10*3/uL (ref 150–450)
RBC: 4.33 x10E6/uL (ref 4.14–5.80)
RDW: 12.6 % (ref 11.6–15.4)
WBC: 4.1 10*3/uL (ref 3.4–10.8)

## 2022-09-09 LAB — TESTOSTERONE: Testosterone: 327 ng/dL (ref 264–916)

## 2022-09-12 ENCOUNTER — Encounter: Payer: Self-pay | Admitting: Urology

## 2022-09-28 ENCOUNTER — Encounter: Payer: Self-pay | Admitting: Urology

## 2022-10-05 ENCOUNTER — Other Ambulatory Visit: Payer: Self-pay | Admitting: Cardiovascular Disease

## 2022-10-30 ENCOUNTER — Telehealth: Payer: Self-pay | Admitting: Urology

## 2022-10-30 NOTE — Telephone Encounter (Signed)
Pt called back and said he would like to send a prescription for the bio-identical testosterone to a compounding pharmacy.

## 2022-10-31 ENCOUNTER — Other Ambulatory Visit: Payer: Self-pay | Admitting: Urology

## 2022-10-31 ENCOUNTER — Other Ambulatory Visit: Payer: Self-pay

## 2022-10-31 ENCOUNTER — Telehealth: Payer: Self-pay | Admitting: Urology

## 2022-10-31 ENCOUNTER — Other Ambulatory Visit: Payer: Self-pay | Admitting: Cardiovascular Disease

## 2022-10-31 DIAGNOSIS — E291 Testicular hypofunction: Secondary | ICD-10-CM

## 2022-10-31 NOTE — Telephone Encounter (Signed)
Current testosterone meds are not strong enough. Would like something more powerful

## 2022-10-31 NOTE — Telephone Encounter (Signed)
Ok per DPR, [facility] notifying pt. Asked pt to return call to schedule 1 month testosterone lab.

## 2022-10-31 NOTE — Telephone Encounter (Signed)
Pt was unaware of previous VM left for him regarding the new testosterone Rx. Gave pt [facility] information and scheduled him for one month testosterone lab. Pt confirmed and expressed understanding.

## 2022-11-15 ENCOUNTER — Ambulatory Visit: Payer: BC Managed Care – PPO | Admitting: Psychiatry

## 2022-11-23 ENCOUNTER — Other Ambulatory Visit: Payer: Self-pay | Admitting: Cardiovascular Disease

## 2022-11-30 ENCOUNTER — Other Ambulatory Visit: Payer: BC Managed Care – PPO

## 2022-11-30 DIAGNOSIS — E291 Testicular hypofunction: Secondary | ICD-10-CM

## 2022-12-01 LAB — TESTOSTERONE: Testosterone: 41 ng/dL — ABNORMAL LOW (ref 264–916)

## 2022-12-05 ENCOUNTER — Other Ambulatory Visit: Payer: Self-pay | Admitting: Cardiovascular Disease

## 2022-12-05 ENCOUNTER — Encounter: Payer: Self-pay | Admitting: Urology

## 2022-12-20 ENCOUNTER — Other Ambulatory Visit: Payer: Self-pay | Admitting: Cardiovascular Disease

## 2022-12-20 DIAGNOSIS — L820 Inflamed seborrheic keratosis: Secondary | ICD-10-CM | POA: Diagnosis not present

## 2022-12-25 ENCOUNTER — Ambulatory Visit: Payer: BC Managed Care – PPO | Admitting: Psychiatry

## 2022-12-25 ENCOUNTER — Encounter: Payer: Self-pay | Admitting: Psychiatry

## 2022-12-25 NOTE — Progress Notes (Deleted)
   CC:  headaches  Follow-up Visit  Last visit: 04/13/22  Brief HPI: 57 year old male with a history of GERD, HTN, HLD, diverticulosis who follows in clinic for right temporal headaches. Brain MRI 04/19/22 showed bilateral arachnoid cysts (stable from 2007) and was otherwise unremarkable. ESR and CRP were wnl.  Interval History: ***   Headache days per month: *** Migraine days per month*** Headache free days per month: ***  Current Headache Regimen: Preventative: *** Abortive: ***   Prior Therapies                                  Ibuprofen Tylenol Losartan 50 mg daily  Physical Exam:   Vital Signs: There were no vitals taken for this visit. GENERAL:  well appearing, in no acute distress, alert  SKIN:  Color, texture, turgor normal. No rashes or lesions HEAD:  Normocephalic/atraumatic. RESP: normal respiratory effort MSK:  No gross joint deformities.   NEUROLOGICAL: Mental Status: Alert, oriented to person, place and time, Follows commands, and Speech fluent and appropriate. Cranial Nerves: PERRL, face symmetric, no dysarthria, hearing grossly intact Motor: moves all extremities equally Gait: normal-based.  IMPRESSION: ***  PLAN: ***   Follow-up: ***  I spent a total of *** minutes on the date of the service. Headache education was done. Discussed lifestyle modification including increased oral hydration, decreased caffeine, exercise and stress management. Discussed treatment options including preventive and acute medications, natural supplements, and infusion therapy. Discussed medication overuse headache and to limit use of acute treatments to no more than 2 days/week or 10 days/month. Discussed medication side effects, adverse reactions and drug interactions. Written educational materials and patient instructions outlining all of the above were given.  Ocie Doyne, MD

## 2022-12-29 DIAGNOSIS — E291 Testicular hypofunction: Secondary | ICD-10-CM | POA: Diagnosis not present

## 2022-12-29 DIAGNOSIS — I10 Essential (primary) hypertension: Secondary | ICD-10-CM | POA: Diagnosis not present

## 2023-01-17 ENCOUNTER — Telehealth: Payer: Self-pay | Admitting: Urology

## 2023-01-17 NOTE — Telephone Encounter (Signed)
Returned pts call, pt states the issue has been resolved.

## 2023-01-17 NOTE — Telephone Encounter (Signed)
Pt left VM letting us know he needs a refill on his medication. Pt did not give name. I called the patient back this morning to get the name; left a voicemail for him to call us back and provide [place] with the name.

## 2023-03-12 ENCOUNTER — Ambulatory Visit: Payer: BC Managed Care – PPO | Admitting: Urology

## 2023-03-12 ENCOUNTER — Encounter: Payer: Self-pay | Admitting: Urology

## 2023-03-12 VITALS — BP 113/71 | HR 71 | Ht 72.0 in | Wt 198.0 lb

## 2023-03-12 DIAGNOSIS — N529 Male erectile dysfunction, unspecified: Secondary | ICD-10-CM | POA: Diagnosis not present

## 2023-03-12 DIAGNOSIS — E291 Testicular hypofunction: Secondary | ICD-10-CM | POA: Diagnosis not present

## 2023-03-12 DIAGNOSIS — Z125 Encounter for screening for malignant neoplasm of prostate: Secondary | ICD-10-CM | POA: Diagnosis not present
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
## 2023-03-13 ENCOUNTER — Encounter: Payer: Self-pay | Admitting: Urology

## 2023-03-13 LAB — TESTOSTERONE: Testosterone: 650 ng/dL (ref 264–916)

## 2023-03-13 LAB — CBC
Hematocrit: 44.3 % (ref 37.5–51.0)
Hemoglobin: 15 g/dL (ref 13.0–17.7)
MCH: 32.1 pg (ref 26.6–33.0)
MCHC: 33.9 g/dL (ref 31.5–35.7)
MCV: 95 fL (ref 79–97)
Platelets: 237 10*3/uL (ref 150–450)
RBC: 4.67 x10E6/uL (ref 4.14–5.80)
RDW: 12.2 % (ref 11.6–15.4)
WBC: 6.3 10*3/uL (ref 3.4–10.8)

## 2023-03-13 LAB — PSA: Prostate Specific Ag, Serum: 0.8 ng/mL (ref 0.0–4.0)

## 2023-03-22 ENCOUNTER — Other Ambulatory Visit: Payer: Self-pay

## 2023-03-22 ENCOUNTER — Other Ambulatory Visit: Payer: Self-pay | Admitting: Cardiovascular Disease

## 2023-03-22 ENCOUNTER — Encounter: Payer: Self-pay | Admitting: Cardiovascular Disease

## 2023-03-23 ENCOUNTER — Telehealth: Payer: Self-pay | Admitting: Cardiovascular Disease

## 2023-03-23 ENCOUNTER — Other Ambulatory Visit: Payer: Self-pay

## 2023-03-23 MED ORDER — ROSUVASTATIN CALCIUM 20 MG PO TABS: 20.0000 mg | ORAL_TABLET | Freq: Every day | ORAL | 0 refills | Status: DC

## 2023-03-23 NOTE — Telephone Encounter (Signed)
*  STAT* If patient is at the pharmacy, call can be transferred to refill team.   1. Which medications need to be refilled? (please list name of each medication and dose if known) Rosuvastatin   2. Would you like to learn more about the convenience, safety, & potential cost savings by using the [facility]?     3. Are you open to using the [facility] (Type [facility].   4. Which pharmacy/location (including street and city if local pharmacy) is medication to be sent to?[facility] [address] [place]   5. Do they need a 30 day or 90 day supply? Enough until his appointment on 05-27-23- please call in today-  out of medicine

## 2023-05-07 ENCOUNTER — Ambulatory Visit: Payer: BC Managed Care – PPO | Admitting: Pulmonary Disease

## 2023-05-07 DIAGNOSIS — H35033 Hypertensive retinopathy, bilateral: Secondary | ICD-10-CM | POA: Diagnosis not present

## 2023-05-10 DIAGNOSIS — Z0000 Encounter for general adult medical examination without abnormal findings: Secondary | ICD-10-CM | POA: Diagnosis not present

## 2023-05-10 DIAGNOSIS — Z6827 Body mass index (BMI) 27.0-27.9, adult: Secondary | ICD-10-CM | POA: Diagnosis not present

## 2023-05-10 DIAGNOSIS — Z1389 Encounter for screening for other disorder: Secondary | ICD-10-CM | POA: Diagnosis not present

## 2023-05-10 DIAGNOSIS — I10 Essential (primary) hypertension: Secondary | ICD-10-CM | POA: Diagnosis not present

## 2023-05-10 DIAGNOSIS — Z125 Encounter for screening for malignant neoplasm of prostate: Secondary | ICD-10-CM | POA: Diagnosis not present

## 2023-05-10 DIAGNOSIS — Z13220 Encounter for screening for lipoid disorders: Secondary | ICD-10-CM | POA: Diagnosis not present

## 2023-05-23 ENCOUNTER — Other Ambulatory Visit: Payer: Self-pay

## 2023-05-28 ENCOUNTER — Ambulatory Visit: Payer: BC Managed Care – PPO | Admitting: Primary Care

## 2023-05-28 ENCOUNTER — Ambulatory Visit: Payer: BC Managed Care – PPO | Admitting: Physician Assistant

## 2023-06-04 NOTE — Progress Notes (Deleted)
  Cardiology Office Note:  .   Date:  06/04/2023  ID:  Roselind Rily, DOB 1965-04-26, MRN [id number] PCP: Ileana Ladd, MD (Inactive)  [facility] Providers Cardiologist:  Kristeen Miss, MD { Click to update primary MD,subspecialty MD or APP then REFRESH:1}   History of Present Illness: Devon Whitaker is a 58 y.o. male   with a past medical history of an ectopic atrial arrhythmia and atypical chest pain/palpitations. 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  ROS: ***  Studies Reviewed: Marland Kitchen         Prior CV Studies: {Select studies to display:26339}  CT coronary 08/2021 IMPRESSION: 1. Coronary calcium score of 87.1. This was 61 percentile for age and sex matched control.   2. Normal coronary origin with right dominance.   3. CAD-RADS 2. Mild non-obstructive CAD (25-49%). Consider non-atherosclerotic causes of chest pain. Consider preventive therapy and risk factor modification.   The noncardiac portion of this study will be interpreted in separate report by the radiologist.     Electronically Signed   By: Thomasene Ripple D.O.   On: 08/02/2021 12:06    Risk Assessment/Calculations:   {Does this patient have ATRIAL FIBRILLATION?:[phone]} No BP recorded.  {Refresh Note OR Click here to enter BP  :1}***       Physical Exam:   VS:  There were no vitals taken for this visit.   Wt Readings from Last 3 Encounters:  03/12/23 198 lb (89.8 kg)  09/08/22 198 lb (89.8 kg)  07/26/22 200 lb (90.7 kg)    GEN: Well nourished, well developed in no acute distress NECK: No JVD; No carotid bruits CARDIAC: ***RRR, no murmurs, rubs, gallops RESPIRATORY:  Clear to auscultation without rales, wheezing or rhonchi  ABDOMEN: Soft, non-tender, non-distended EXTREMITIES:  No edema; No deformity   ASSESSMENT AND PLAN: .    History of atypical chest pain nonobstructive CAD on CT  History of ectopic atrial arrhythmias.     {Are you ordering a CV Procedure (e.g. stress  test, cath, DCCV, TEE, etc)?   Press F2        :[id number]}  Dispo: ***  Signed, Jacolyn Reedy, PA-C

## 2023-06-05 DIAGNOSIS — L820 Inflamed seborrheic keratosis: Secondary | ICD-10-CM | POA: Diagnosis not present

## 2023-06-10 ENCOUNTER — Other Ambulatory Visit: Payer: Self-pay | Admitting: Cardiovascular Disease

## 2023-06-12 ENCOUNTER — Other Ambulatory Visit: Payer: Self-pay

## 2023-06-12 ENCOUNTER — Ambulatory Visit: Payer: BC Managed Care – PPO | Admitting: Physician Assistant

## 2023-06-12 MED ORDER — ROSUVASTATIN CALCIUM 20 MG PO TABS: 20.0000 mg | ORAL_TABLET | Freq: Every day | ORAL | 0 refills | Status: DC

## 2023-06-18 ENCOUNTER — Ambulatory Visit: Payer: BC Managed Care – PPO | Admitting: Primary Care

## 2023-06-18 ENCOUNTER — Encounter: Payer: Self-pay | Admitting: Primary Care

## 2023-06-18 VITALS — BP 127/75 | HR 85 | Ht 72.0 in | Wt 204.6 lb

## 2023-06-18 DIAGNOSIS — I2510 Atherosclerotic heart disease of native coronary artery without angina pectoris: Secondary | ICD-10-CM

## 2023-06-18 DIAGNOSIS — R0609 Other forms of dyspnea: Secondary | ICD-10-CM | POA: Diagnosis not present

## 2023-06-18 MED ORDER — ALBUTEROL SULFATE HFA 108 (90 BASE) MCG/ACT IN AERS: 2.0000 | INHALATION_SPRAY | Freq: Four times a day (QID) | RESPIRATORY_TRACT | 1 refills | Status: DC | PRN

## 2023-06-18 MED ORDER — OMEPRAZOLE 20 MG PO CPDR: 20.0000 mg | DELAYED_RELEASE_CAPSULE | Freq: Two times a day (BID) | ORAL | 1 refills | Status: DC
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
## 2023-06-18 NOTE — Patient Instructions (Signed)
-  EXERTIONAL DYSPNEA: Exertional dyspnea means experiencing shortness of breath during physical activity. To investigate the cause, we will conduct a pulmonary function test to check for lung issues and provide you with an albuterol inhaler to use before exercise to see if it helps. If the test shows any problems, we may consider a chest CT scan.  -GASTROESOPHAGEAL REFLUX DISEASE (GERD): GERD is a condition where stomach acid frequently flows back into the tube connecting your mouth and stomach, causing irritation. To help manage your symptoms, we are increasing your omeprazole dose to 20 mg twice daily for 4-6 weeks.  -MILD NONOBSTRUCTIVE CORONARY ARTERY DISEASE: This condition involves a partial blockage in the coronary arteries, which can affect blood flow to the heart. You should continue taking Crestor and losartan as prescribed and follow up with your cardiologist at the end of the month.  INSTRUCTIONS: Please follow up with your cardiologist at the end of the month as planned. Additionally, use the albuterol inhaler 15 minutes before exercise and monitor your symptoms. If you notice any changes or have concerns, contact our office.  Follow-up Please schedule patient for PFTs 1 hour- first available Visit with [facility] NP in 6 weeks

## 2023-06-23 ENCOUNTER — Other Ambulatory Visit: Payer: Self-pay | Admitting: Primary Care

## 2023-06-25 NOTE — Telephone Encounter (Signed)
 Pharmacy comment: Alternative Requested:PA SENT

## 2023-06-26 NOTE — Telephone Encounter (Signed)
Sent in alternative

## 2023-07-09 NOTE — Progress Notes (Unsigned)
 Cardiology Office Note:  .   Date:  07/10/2023  ID:  Devon Whitaker, DOB Aug 22, 1965, MRN [id number] PCP: Ileana Ladd, MD (Inactive)   [facility] Providers Cardiologist:  Kristeen Miss, MD {  History of Present Illness: Devon Whitaker is a 58 y.o. male with a past medical history of palpitations and chest pain here for follow-up appointment.  Has been followed by Dr. Melburn Popper since October 2017.  Normal GXT in March 2022.  Normal echocardiogram.  Trivial mitral regurgitation.  Was exercising vigorously 3 days a week.  No chest pain with weight lifting.  Does some cardio on the treadmill.  Occasionally has a sensation of breathing in cold air/stinging when walking on the treadmill.  Since he was doing well he was able to follow-up on a PRN basis.  Today, he presents with a history of cardiac issues, reports no chest pains or shortness of breath since his last visit with Dr. Elease Hashimoto. He occasionally experiences a flutter in the chest, which lasts for a few seconds and occurs about once a month. The patient maintains a healthy lifestyle, with regular exercise and a good diet. He is currently on Hyzaar and Crestor, with no reported side effects. Recent labs show good cholesterol levels, but slightly high triglycerides. The patient also has a calcium score of 87, placing him in the 70th percentile.  Reports no shortness of breath nor dyspnea on exertion. Reports no chest pain, pressure, or tightness. No edema, orthopnea, PND. Reports no palpitations.   Discussed the use of AI scribe software for clinical note transcription with the patient, who gave verbal consent to proceed.   ROS: Pertinent ROS in HPI  Studies Reviewed: Marland Kitchen   EKG Interpretation Date/Time:  Tuesday July 10 2023 09:13:49 EDT Ventricular Rate:  81 PR Interval:  156 QRS Duration:  94 QT Interval:  354 QTC Calculation: 411 R Axis:   81  Text Interpretation: Normal sinus rhythm Normal ECG When compared with ECG of  22-Mar-2020 07:21, ST no longer elevated in Inferior leads T wave inversion now evident in Inferior leads Confirmed by Jari Favre [phone]) on 07/10/2023 9:23:15 AM   CT coronary 08/02/2021  IMPRESSION: 1. Coronary calcium score of 87.1. This was 45 percentile for age and sex matched control.   2. Normal coronary origin with right dominance.   3. CAD-RADS 2. Mild non-obstructive CAD (25-49%). Consider non-atherosclerotic causes of chest pain. Consider preventive therapy and risk factor modification.   The noncardiac portion of this study will be interpreted in separate report by the radiologist.     Physical Exam:   VS:  BP 112/76   Pulse 81   Ht 6' (1.829 m)   Wt 197 lb (89.4 kg)   SpO2 98%   BMI 26.72 kg/m    Wt Readings from Last 3 Encounters:  07/10/23 197 lb (89.4 kg)  06/18/23 204 lb 9.6 oz (92.8 kg)  03/12/23 198 lb (89.8 kg)    GEN: Well nourished, well developed in no acute distress NECK: No JVD; No carotid bruits CARDIAC: RRR, no murmurs, rubs, gallops RESPIRATORY:  Clear to auscultation without rales, wheezing or rhonchi  ABDOMEN: Soft, non-tender, non-distended EXTREMITIES:  No edema; No deformity   ASSESSMENT AND PLAN: .    Coronary Artery Disease calcium score of 87, asymptomatic. Focus on risk factor modification to prevent progression. - Continue Hyzaar and Crestor. - Maintain LDL below 55. - Encourage regular exercise. - Adopt a Mediterranean diet. - Monitor for symptoms such  as chest pain or significant shortness of breath. - Consider further testing if symptoms develop.  Hyperlipidemia Hyperlipidemia well-controlled with Crestor. LDL at 51 mg/dL, triglycerides slightly elevated due to diet. Maintaining LDL below 55 is crucial. - Continue Crestor. - Encourage dietary modifications to reduce triglycerides, focusing on reducing simple carbohydrates and alcohol intake.  Mitral Valve Regurgitation Trivial mitral valve regurgitation, asymptomatic, no  significant changes in cardiac function.  Follow-up Transitioning care to a new cardiologist due to current provider's impending retirement. - Assign a new cardiologist for continuity of care. - Schedule follow-up in one year unless symptoms develop.      Dispo: He can follow-up in a year to establish with a new MD  Signed, Sharlene Dory, PA-C

## 2023-07-10 ENCOUNTER — Encounter: Payer: Self-pay | Admitting: Physician Assistant

## 2023-07-10 ENCOUNTER — Ambulatory Visit: Attending: Cardiology | Admitting: Physician Assistant

## 2023-07-10 VITALS — BP 112/76 | HR 81 | Ht 72.0 in | Wt 197.0 lb

## 2023-07-10 DIAGNOSIS — Z79899 Other long term (current) drug therapy: Secondary | ICD-10-CM

## 2023-07-10 DIAGNOSIS — R079 Chest pain, unspecified: Secondary | ICD-10-CM | POA: Diagnosis not present

## 2023-07-10 DIAGNOSIS — R002 Palpitations: Secondary | ICD-10-CM

## 2023-07-10 MED ORDER — LOSARTAN POTASSIUM-HCTZ 100-25 MG PO TABS: 1.0000 | ORAL_TABLET | Freq: Every day | ORAL | 3 refills | Status: AC

## 2023-07-10 MED ORDER — ROSUVASTATIN CALCIUM 20 MG PO TABS: 20.0000 mg | ORAL_TABLET | Freq: Every day | ORAL | 3 refills | Status: DC

## 2023-07-10 NOTE — Patient Instructions (Signed)
 Medication Instructions:   Your physician recommends that you continue on your current medications as directed. Please refer to the Current Medication list given to you today.  *If you need a refill on your cardiac medications before your next appointment, please call your pharmacy*   Follow-Up: At [facility], you and your health needs are our priority.  As part of our continuing mission to provide you with exceptional heart care, our providers are all part of one team.  This team includes your primary Cardiologist (physician) and Advanced Practice Providers or APPs (Physician Assistants and Nurse Practitioners) who all work together to provide you with the care you need, when you need it.  Your next appointment:   1 year(s)  Provider:   DR. Odis Hollingshead  We recommend signing up for the patient portal called "MyChart".  Sign up information is provided on this After Visit Summary.  MyChart is used to connect with patients for Virtual Visits (Telemedicine).  Patients are able to view lab/test results, encounter notes, upcoming appointments, etc.  Non-urgent messages can be sent to your provider as well.   To learn more about what you can do with MyChart, go to [email or web address].      1st Floor: - Lobby - Registration  - Pharmacy  - Lab - Cafe  2nd Floor: - PV Lab - Diagnostic Testing (echo, CT, nuclear med)  3rd Floor: - Vacant  4th Floor: - TCTS (cardiothoracic surgery) - AFib Clinic - Structural Heart Clinic - Vascular Surgery  - Vascular Ultrasound  5th Floor: - [facility] (general and EP) - Clinical Pharmacy for coumadin, hypertension, lipid, weight-loss medications, and med management appointments    Valet parking services will be available as well.

## 2023-08-28 ENCOUNTER — Encounter

## 2023-08-31 ENCOUNTER — Ambulatory Visit: Admitting: Primary Care

## 2023-09-10 ENCOUNTER — Ambulatory Visit: Payer: BC Managed Care – PPO | Admitting: Urology

## 2023-09-10 ENCOUNTER — Encounter: Payer: Self-pay | Admitting: Urology

## 2023-09-10 VITALS — BP 110/70 | HR 99 | Ht 72.0 in | Wt 195.0 lb

## 2023-09-10 DIAGNOSIS — E291 Testicular hypofunction: Secondary | ICD-10-CM | POA: Diagnosis not present

## 2023-09-10 DIAGNOSIS — N529 Male erectile dysfunction, unspecified: Secondary | ICD-10-CM
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
## 2023-09-11 ENCOUNTER — Ambulatory Visit: Payer: Self-pay | Admitting: Urology

## 2023-09-11 LAB — CBC
Hematocrit: 42.6 % (ref 37.5–51.0)
Hemoglobin: 14.4 g/dL (ref 13.0–17.7)
MCH: 32.4 pg (ref 26.6–33.0)
MCHC: 33.8 g/dL (ref 31.5–35.7)
MCV: 96 fL (ref 79–97)
Platelets: 226 10*3/uL (ref 150–450)
RBC: 4.44 x10E6/uL (ref 4.14–5.80)
RDW: 12.5 % (ref 11.6–15.4)
WBC: 9.7 10*3/uL (ref 3.4–10.8)

## 2023-09-11 LAB — TESTOSTERONE: Testosterone: 811 ng/dL (ref 264–916)

## 2023-09-14 ENCOUNTER — Other Ambulatory Visit: Payer: Self-pay | Admitting: Cardiovascular Disease

## 2023-09-27 DIAGNOSIS — K1120 Sialoadenitis, unspecified: Secondary | ICD-10-CM | POA: Diagnosis not present

## 2023-09-27 DIAGNOSIS — Z6827 Body mass index (BMI) 27.0-27.9, adult: Secondary | ICD-10-CM | POA: Diagnosis not present

## 2023-10-12 ENCOUNTER — Encounter

## 2023-10-29 ENCOUNTER — Ambulatory Visit: Admitting: Primary Care

## 2023-11-05 ENCOUNTER — Encounter

## 2023-11-16 ENCOUNTER — Other Ambulatory Visit: Payer: Self-pay

## 2023-11-16 DIAGNOSIS — R0609 Other forms of dyspnea: Secondary | ICD-10-CM

## 2023-11-21 ENCOUNTER — Ambulatory Visit: Admitting: Pulmonary Disease

## 2023-11-21 DIAGNOSIS — R0609 Other forms of dyspnea: Secondary | ICD-10-CM

## 2023-11-21 LAB — PULMONARY FUNCTION TEST
DL/VA % pred: 109 %
DL/VA: 4.58 ml/min/mmHg/L
DLCO unc % pred: 103 %
DLCO unc: 30.48 ml/min/mmHg
FEF 25-75 Post: 3.33 L/s
FEF 25-75 Pre: 2.93 L/s
FEF2575-%Change-Post: 13 %
FEF2575-%Pred-Post: 105 %
FEF2575-%Pred-Pre: 92 %
FEV1-%Change-Post: 2 %
FEV1-%Pred-Post: 94 %
FEV1-%Pred-Pre: 92 %
FEV1-Post: 3.65 L
FEV1-Pre: 3.57 L
FEV1FVC-%Change-Post: 2 %
FEV1FVC-%Pred-Pre: 101 %
FEV6-%Change-Post: 0 %
FEV6-%Pred-Post: 94 %
FEV6-%Pred-Pre: 94 %
FEV6-Post: 4.64 L
FEV6-Pre: 4.63 L
FEV6FVC-%Change-Post: 0 %
FEV6FVC-%Pred-Post: 104 %
FEV6FVC-%Pred-Pre: 104 %
FVC-%Change-Post: 0 %
FVC-%Pred-Post: 91 %
FVC-%Pred-Pre: 91 %
FVC-Post: 4.67 L
FVC-Pre: 4.66 L
Post FEV1/FVC ratio: 78 %
Post FEV6/FVC ratio: 99 %
Pre FEV1/FVC ratio: 77 %
Pre FEV6/FVC Ratio: 99 %
RV % pred: 103 %
RV: 2.44 L
TLC % pred: 96 %
TLC: 7.13 L

## 2023-11-21 NOTE — Progress Notes (Signed)
 Full pft performed today

## 2023-11-21 NOTE — Patient Instructions (Signed)
 Full pft performed today

## 2023-12-13 ENCOUNTER — Ambulatory Visit

## 2023-12-13 ENCOUNTER — Encounter: Payer: Self-pay | Admitting: Primary Care

## 2023-12-13 ENCOUNTER — Ambulatory Visit: Admitting: Primary Care

## 2023-12-13 VITALS — BP 128/70 | HR 65 | Temp 97.7°F | Ht 72.0 in | Wt 202.2 lb

## 2023-12-13 DIAGNOSIS — R0609 Other forms of dyspnea: Secondary | ICD-10-CM

## 2023-12-13 DIAGNOSIS — R0600 Dyspnea, unspecified: Secondary | ICD-10-CM | POA: Diagnosis not present

## 2023-12-13 NOTE — Patient Instructions (Signed)
  VISIT SUMMARY: Today, you were seen for shortness of breath during physical activity. You described this as a 'weird sensation' and a feeling of constriction similar to breathing in cold air. This sensation has been slowly improving over time. You also discussed your history of gastroesophageal reflux disease (GERD) and its management with omeprazole .  YOUR PLAN: -DYSPNEA ON EXERTION: Dyspnea on exertion means experiencing shortness of breath during physical activity. Your pulmonary function tests are normal, ruling out conditions like COPD, obstructive lung disease, or asthma. To further investigate potential cardiac causes, we will order an echocardiogram to check for valvular abnormalities, heart failure, or pulmonary hypertension, and a chest x-ray to complete the workup.  -GASTROESOPHAGEAL REFLUX DISEASE (GERD): GERD is a condition where stomach acid frequently flows back into the tube connecting your mouth and stomach, causing irritation. You are managing it with omeprazole , which you take once daily in the morning and occasionally a second dose in the evening if needed. This medication should not interfere with your ability to exercise. Continue with your current regimen.  INSTRUCTIONS: Please schedule an echocardiogram and a chest x-ray as soon as possible to further investigate the cause of your shortness of breath. Continue taking omeprazole  as prescribed, and follow up with us  if you experience any new or worsening symptoms.  Orders: CXR Echocardiogram   Follow-up: As needed if symptoms do not improve or worsen
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
## 2023-12-20 ENCOUNTER — Ambulatory Visit: Payer: Self-pay | Admitting: Primary Care

## 2023-12-20 NOTE — Progress Notes (Signed)
 Spoke with pt and relayed result, pt stated understanding. nfn

## 2023-12-20 NOTE — Progress Notes (Signed)
Please let patient know CXR was normal

## 2024-01-10 ENCOUNTER — Telehealth: Payer: Self-pay | Admitting: *Deleted

## 2024-01-10 DIAGNOSIS — Z6827 Body mass index (BMI) 27.0-27.9, adult: Secondary | ICD-10-CM | POA: Diagnosis not present

## 2024-01-10 DIAGNOSIS — I2510 Atherosclerotic heart disease of native coronary artery without angina pectoris: Secondary | ICD-10-CM | POA: Diagnosis not present

## 2024-01-10 DIAGNOSIS — G4700 Insomnia, unspecified: Secondary | ICD-10-CM | POA: Diagnosis not present

## 2024-01-10 DIAGNOSIS — K219 Gastro-esophageal reflux disease without esophagitis: Secondary | ICD-10-CM | POA: Diagnosis not present

## 2024-01-10 DIAGNOSIS — I340 Nonrheumatic mitral (valve) insufficiency: Secondary | ICD-10-CM | POA: Diagnosis not present

## 2024-01-10 NOTE — Telephone Encounter (Signed)
   Pre-operative Risk Assessment    Patient Name: Devon Whitaker  DOB: 1966/03/27 MRN: [id number]   Date of last office visit: 07/10/23 Date of next office visit: N/A   Request for Surgical Clearance    Procedure:  COLONOSCOPY  Date of Surgery:  Clearance 02/25/24                                Surgeon:  DR. ELSIE CREE Surgeon's Group or Practice Name:  [facility] Phone number:  [phone] Fax number:  [phone]   Type of Clearance Requested:   - Medical    Type of Anesthesia:  PROPOFOL    Additional requests/questions:    [facility]   01/10/2024, 12:02 PM

## 2024-01-10 NOTE — Telephone Encounter (Signed)
   Name: Devon Whitaker  DOB: 04-Jan-1966  MRN: [id number]  Primary Cardiologist: Aleene Passe, MD (Inactive)   Preoperative team, please contact this patient and set up a phone call appointment for further preoperative risk assessment. Please obtain consent and complete medication review. Thank you for your help.  I confirm that guidance regarding antiplatelet and oral anticoagulation therapy has been completed and, if necessary, noted below.  None requested.  I also confirmed the patient resides in the state of [place] . As per [facility] Medical Board telemedicine laws, the patient must reside in the state in which the provider is licensed.   Josefa CHRISTELLA Beauvais, NP 01/10/2024, 12:17 PM [facility]

## 2024-01-10 NOTE — Telephone Encounter (Signed)
1st attempt to reach pt regarding surgical clearance and the need for a tele visit, left a message for pt to call back and ask for the preop team.  

## 2024-01-11 ENCOUNTER — Telehealth (HOSPITAL_BASED_OUTPATIENT_CLINIC_OR_DEPARTMENT_OTHER): Payer: Self-pay

## 2024-01-11 NOTE — Telephone Encounter (Signed)
 Called patient to set up an televisit appointment for pre-op clearance on 11/03 @ 10:20. Meds, Rec, and consent done.       Patient Consent for Virtual Visit        Devon Whitaker has provided verbal consent on 01/11/2024 for a virtual visit (video or telephone).   CONSENT FOR VIRTUAL VISIT FOR:  Devon Whitaker  By participating in this virtual visit I agree to the following:  I hereby voluntarily request, consent and authorize [facility] and its employed or contracted physicians, physician assistants, nurse practitioners or other licensed health care professionals (the Practitioner), to provide me with telemedicine health care services (the "Services) as deemed necessary by the treating Practitioner. I acknowledge and consent to receive the Services by the Practitioner via telemedicine. I understand that the telemedicine visit will involve communicating with the Practitioner through live audiovisual communication technology and the disclosure of certain medical information by electronic transmission. I acknowledge that I have been given the opportunity to request an in-person assessment or other available alternative prior to the telemedicine visit and am voluntarily participating in the telemedicine visit.  I understand that I have the right to withhold or withdraw my consent to the use of telemedicine in the course of my care at any time, without affecting my right to future care or treatment, and that the Practitioner or I may terminate the telemedicine visit at any time. I understand that I have the right to inspect all information obtained and/or recorded in the course of the telemedicine visit and may receive copies of available information for a reasonable fee.  I understand that some of the potential risks of receiving the Services via telemedicine include:  Delay or interruption in medical evaluation due to technological equipment failure or disruption; Information transmitted  may not be sufficient (e.g. poor resolution of images) to allow for appropriate medical decision making by the Practitioner; and/or  In rare instances, security protocols could fail, causing a breach of personal health information.  Furthermore, I acknowledge that it is my responsibility to provide information about my medical history, conditions and care that is complete and accurate to the best of my ability. I acknowledge that Practitioner's advice, recommendations, and/or decision may be based on factors not within their control, such as incomplete or inaccurate data provided by me or distortions of diagnostic images or specimens that may result from electronic transmissions. I understand that the practice of medicine is not an exact science and that Practitioner makes no warranties or guarantees regarding treatment outcomes. I acknowledge that a copy of this consent can be made available to me via my patient portal [facility] MyChart), or I can request a printed copy by calling the office of [facility].    I understand that my insurance will be billed for this visit.   I have read or had this consent read to me. I understand the contents of this consent, which adequately explains the benefits and risks of the Services being provided via telemedicine.  I have been provided ample opportunity to ask questions regarding this consent and the Services and have had my questions answered to my satisfaction. I give my informed consent for the services to be provided through the use of telemedicine in my medical care

## 2024-01-11 NOTE — Telephone Encounter (Signed)
 Called patient to set up an televisit appointment for pre-op clearance on 11/03 @ 10:20. Meds, Rec, and consent done.

## 2024-01-16 ENCOUNTER — Ambulatory Visit (HOSPITAL_COMMUNITY)
Admission: RE | Admit: 2024-01-16 | Discharge: 2024-01-16 | Disposition: A | Discharge status: Home / Self Care | Source: Ambulatory Visit | Attending: Cardiovascular Disease | Admitting: Cardiovascular Disease

## 2024-01-16 DIAGNOSIS — R0609 Other forms of dyspnea: Secondary | ICD-10-CM | POA: Diagnosis not present

## 2024-01-16 LAB — ECHOCARDIOGRAM COMPLETE
AR max vel: 2.72 cm2
AV Area VTI: 2.57 cm2
AV Area mean vel: 2.54 cm2
AV Mean grad: 3 mmHg
AV Peak grad: 5.1 mmHg
Ao pk vel: 1.13 m/s
Area-P 1/2: 4.06 cm2
MV M vel: 0.93 m/s
MV Peak grad: 3.4 mmHg
S' Lateral: 2.6 cm

## 2024-02-04 ENCOUNTER — Ambulatory Visit: Attending: Cardiovascular Disease | Admitting: Nurse Practitioner

## 2024-02-04 ENCOUNTER — Encounter: Payer: Self-pay | Admitting: Nurse Practitioner

## 2024-02-04 DIAGNOSIS — Z01810 Encounter for preprocedural cardiovascular examination: Secondary | ICD-10-CM
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
## 2024-02-25 DIAGNOSIS — D124 Benign neoplasm of descending colon: Secondary | ICD-10-CM | POA: Diagnosis not present

## 2024-02-25 DIAGNOSIS — K5730 Diverticulosis of large intestine without perforation or abscess without bleeding: Secondary | ICD-10-CM | POA: Diagnosis not present

## 2024-02-25 DIAGNOSIS — Z1211 Encounter for screening for malignant neoplasm of colon: Secondary | ICD-10-CM | POA: Diagnosis not present

## 2024-03-10 ENCOUNTER — Telehealth: Payer: Self-pay | Admitting: Urology
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
## 2024-03-11 ENCOUNTER — Ambulatory Visit: Admitting: Urology

## 2024-03-18 DIAGNOSIS — G4700 Insomnia, unspecified: Secondary | ICD-10-CM | POA: Diagnosis not present

## 2024-03-18 DIAGNOSIS — Z23 Encounter for immunization: Secondary | ICD-10-CM | POA: Diagnosis not present

## 2024-03-18 DIAGNOSIS — E785 Hyperlipidemia, unspecified: Secondary | ICD-10-CM | POA: Diagnosis not present

## 2024-03-18 DIAGNOSIS — I10 Essential (primary) hypertension: Secondary | ICD-10-CM | POA: Diagnosis not present

## 2024-03-18 DIAGNOSIS — Z6827 Body mass index (BMI) 27.0-27.9, adult: Secondary | ICD-10-CM | POA: Diagnosis not present

## 2024-04-09 ENCOUNTER — Other Ambulatory Visit: Payer: Self-pay | Admitting: Family Medicine

## 2024-04-09 DIAGNOSIS — R1011 Right upper quadrant pain: Secondary | ICD-10-CM

## 2024-04-15 ENCOUNTER — Encounter: Payer: Self-pay | Admitting: Urology

## 2024-04-15 ENCOUNTER — Ambulatory Visit: Admitting: Urology

## 2024-04-15 VITALS — BP 105/62 | HR 70 | Ht 72.0 in | Wt 204.0 lb

## 2024-04-15 DIAGNOSIS — N529 Male erectile dysfunction, unspecified: Secondary | ICD-10-CM

## 2024-04-15 DIAGNOSIS — E291 Testicular hypofunction: Secondary | ICD-10-CM

## 2024-04-15 LAB — CBC
Hematocrit: 41.3 % (ref 37.5–51.0)
Hemoglobin: 13.9 g/dL (ref 13.0–17.7)
MCH: 32.6 pg (ref 26.6–33.0)
MCHC: 33.7 g/dL (ref 31.5–35.7)
MCV: 97 fL (ref 79–97)
Platelets: 280 x10E3/uL (ref 150–450)
RBC: 4.27 x10E6/uL (ref 4.14–5.80)
RDW: 12.6 % (ref 11.6–15.4)
WBC: 5.3 x10E3/uL (ref 3.4–10.8)

## 2024-04-15 LAB — TESTOSTERONE: Testosterone: 369 ng/dL (ref 264–916)

## 2024-04-15 LAB — PSA: Prostate Specific Ag, Serum: 1.1 ng/mL (ref 0.0–4.0)
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
## 2024-04-16 ENCOUNTER — Ambulatory Visit: Payer: Self-pay | Admitting: Urology

## 2024-04-18 ENCOUNTER — Ambulatory Visit
Admission: RE | Admit: 2024-04-18 | Discharge: 2024-04-18 | Disposition: A | Discharge status: Home / Self Care | Source: Ambulatory Visit | Attending: Family Medicine | Admitting: Family Medicine

## 2024-04-18 DIAGNOSIS — R1011 Right upper quadrant pain: Secondary | ICD-10-CM

## 2024-10-16 ENCOUNTER — Ambulatory Visit: Admitting: Urology
